# Patient Record
Sex: Female | Born: 1965 | Race: White | Hispanic: No | Marital: Single | Smoking: Current every day smoker
Health system: Southern US, Community
[De-identification: ages and names within clinical notes are randomized; demographics above are authoritative.]

## PROBLEM LIST (undated history)

## (undated) DIAGNOSIS — I509 Heart failure, unspecified: Secondary | ICD-10-CM

## (undated) DIAGNOSIS — M1990 Unspecified osteoarthritis, unspecified site: Secondary | ICD-10-CM

## (undated) DIAGNOSIS — M5136 Other intervertebral disc degeneration, lumbar region: Secondary | ICD-10-CM

## (undated) DIAGNOSIS — M51369 Other intervertebral disc degeneration, lumbar region without mention of lumbar back pain or lower extremity pain: Secondary | ICD-10-CM

## (undated) HISTORY — PX: OTHER SURGICAL HISTORY: SHX169

## (undated) HISTORY — PX: BACK SURGERY: SHX140

## (undated) HISTORY — PX: TOTAL KNEE ARTHROPLASTY: SHX125

---

## 2004-03-03 ENCOUNTER — Emergency Department (HOSPITAL_COMMUNITY): Admission: EM | Admit: 2004-03-03 | Discharge: 2004-03-04 | Payer: Self-pay | Admitting: *Deleted

## 2004-03-04 ENCOUNTER — Emergency Department (HOSPITAL_COMMUNITY): Admission: EM | Admit: 2004-03-04 | Discharge: 2004-03-04 | Payer: Self-pay | Admitting: Emergency Medicine

## 2005-09-18 ENCOUNTER — Emergency Department (HOSPITAL_COMMUNITY): Admission: EM | Admit: 2005-09-18 | Discharge: 2005-09-19 | Payer: Self-pay | Admitting: Emergency Medicine

## 2006-02-25 ENCOUNTER — Emergency Department (HOSPITAL_COMMUNITY): Admission: EM | Admit: 2006-02-25 | Discharge: 2006-02-26 | Payer: Self-pay | Admitting: Emergency Medicine

## 2006-02-27 ENCOUNTER — Inpatient Hospital Stay: Payer: Self-pay | Admitting: Internal Medicine

## 2006-03-01 ENCOUNTER — Inpatient Hospital Stay (HOSPITAL_COMMUNITY): Admission: EM | Admit: 2006-03-01 | Discharge: 2006-03-08 | Payer: Self-pay | Admitting: Emergency Medicine

## 2006-03-09 ENCOUNTER — Emergency Department (HOSPITAL_COMMUNITY): Admission: EM | Admit: 2006-03-09 | Discharge: 2006-03-09 | Payer: Self-pay | Admitting: Emergency Medicine

## 2006-03-17 ENCOUNTER — Emergency Department (HOSPITAL_COMMUNITY): Admission: EM | Admit: 2006-03-17 | Discharge: 2006-03-17 | Payer: Self-pay | Admitting: Emergency Medicine

## 2006-03-18 ENCOUNTER — Ambulatory Visit: Payer: Self-pay | Admitting: Psychiatry

## 2006-03-18 ENCOUNTER — Emergency Department (HOSPITAL_COMMUNITY): Admission: EM | Admit: 2006-03-18 | Discharge: 2006-03-19 | Payer: Self-pay | Admitting: Emergency Medicine

## 2006-03-19 ENCOUNTER — Inpatient Hospital Stay (HOSPITAL_COMMUNITY): Admission: AD | Admit: 2006-03-19 | Discharge: 2006-03-28 | Payer: Self-pay | Admitting: Psychiatry

## 2006-04-29 ENCOUNTER — Inpatient Hospital Stay (HOSPITAL_COMMUNITY): Admission: EM | Admit: 2006-04-29 | Discharge: 2006-05-04 | Payer: Self-pay | Admitting: Emergency Medicine

## 2006-05-07 ENCOUNTER — Inpatient Hospital Stay (HOSPITAL_COMMUNITY): Admission: EM | Admit: 2006-05-07 | Discharge: 2006-05-11 | Payer: Self-pay | Admitting: Emergency Medicine

## 2006-05-12 ENCOUNTER — Emergency Department (HOSPITAL_COMMUNITY): Admission: EM | Admit: 2006-05-12 | Discharge: 2006-05-12 | Payer: Self-pay | Admitting: *Deleted

## 2006-05-14 ENCOUNTER — Emergency Department (HOSPITAL_COMMUNITY): Admission: EM | Admit: 2006-05-14 | Discharge: 2006-05-14 | Payer: Self-pay | Admitting: Emergency Medicine

## 2006-05-14 ENCOUNTER — Ambulatory Visit: Payer: Self-pay | Admitting: Internal Medicine

## 2006-05-30 ENCOUNTER — Emergency Department: Payer: Self-pay

## 2006-08-09 ENCOUNTER — Emergency Department (HOSPITAL_COMMUNITY): Admission: EM | Admit: 2006-08-09 | Discharge: 2006-08-09 | Payer: Self-pay | Admitting: Emergency Medicine

## 2006-09-02 ENCOUNTER — Emergency Department (HOSPITAL_COMMUNITY): Admission: EM | Admit: 2006-09-02 | Discharge: 2006-09-02 | Payer: Self-pay | Admitting: Emergency Medicine

## 2006-10-10 ENCOUNTER — Emergency Department (HOSPITAL_COMMUNITY): Admission: EM | Admit: 2006-10-10 | Discharge: 2006-10-10 | Payer: Self-pay | Admitting: *Deleted

## 2008-01-06 ENCOUNTER — Emergency Department (HOSPITAL_COMMUNITY): Admission: EM | Admit: 2008-01-06 | Discharge: 2008-01-06 | Payer: Self-pay | Admitting: Emergency Medicine

## 2009-03-25 ENCOUNTER — Emergency Department (HOSPITAL_BASED_OUTPATIENT_CLINIC_OR_DEPARTMENT_OTHER): Admission: EM | Admit: 2009-03-25 | Discharge: 2009-03-25 | Payer: Self-pay | Admitting: Emergency Medicine

## 2009-03-25 ENCOUNTER — Ambulatory Visit: Payer: Self-pay | Admitting: Diagnostic Radiology

## 2009-10-27 ENCOUNTER — Emergency Department (HOSPITAL_BASED_OUTPATIENT_CLINIC_OR_DEPARTMENT_OTHER): Admission: EM | Admit: 2009-10-27 | Discharge: 2009-10-27 | Payer: Self-pay | Admitting: Emergency Medicine

## 2009-11-08 ENCOUNTER — Emergency Department (HOSPITAL_COMMUNITY): Admission: EM | Admit: 2009-11-08 | Discharge: 2009-11-08 | Payer: Self-pay | Admitting: Emergency Medicine

## 2009-11-15 ENCOUNTER — Emergency Department (HOSPITAL_COMMUNITY): Admission: EM | Admit: 2009-11-15 | Discharge: 2009-11-15 | Payer: Self-pay | Admitting: Emergency Medicine

## 2010-01-23 ENCOUNTER — Emergency Department (HOSPITAL_BASED_OUTPATIENT_CLINIC_OR_DEPARTMENT_OTHER): Admission: EM | Admit: 2010-01-23 | Discharge: 2010-01-23 | Payer: Self-pay | Admitting: Emergency Medicine

## 2010-01-23 ENCOUNTER — Ambulatory Visit: Payer: Self-pay | Admitting: Diagnostic Radiology

## 2010-05-29 LAB — PREGNANCY, URINE: Preg Test, Ur: NEGATIVE

## 2010-07-26 NOTE — Discharge Summary (Signed)
[name], Brooke [name]                 ACCOUNT NO.:  [id number]   MEDICAL RECORD NO.:  [id number]          PATIENT TYPE:  INP   LOCATION:  1341                         FACILITY:  [facility]   PHYSICIAN:  Hind I Elsaid, MD      DATE OF BIRTH:  1965-12-13   DATE OF ADMISSION:  05/07/2006  DATE OF DISCHARGE:  05/11/2006                               DISCHARGE SUMMARY   DISCHARGE DIAGNOSES:  1. Alcohol abuse with dependency.  2. Schizoaffective disorder.  3. Polysubstance abuse including tobacco, alcohol, and cocaine.  4. Chronic opioid dependence.  5. Generalized anxiety disorder.  6. History of previous admission for acute pancreatitis.   DISCHARGE MEDICATIONS:  The patient left the hospital on May 11, 2006.  We were called by the staff to see patient was missing.  The  patient called staff to say she was about 3 minutes from the hospital  and the patient made aware she could not come back to the room and she  should be readmitted since she left the premises without informing  anybody.   CONSULT:  Gastroenterology consulted for severe pain and psychiatry  consulted for alcoholic and request for detoxification.   #1 - She is a 45 year old female with a history of chronic pancreatitis,  history of polysubstance abuse on chronic opioid dependence.  She came  complaining of would like to detoxify and during hospitalization  continued with severe abdominal pain.  Abdominal pain could be  gastritis, pancreatitis.  CT scan was done with no evidence of acute or  chronic pancreatitis.  The patient continued on supportive measures with  Protonix.  During hospitalization the patient continued to demand many  Dilaudid for relieving of her epigastric pain.  Gastroenterology was  requested to see the patient for evaluation of her pain.  The patient  was scheduled to undergo EGD on February 29.   #2 - POLYSUBSTANCE ABUSE WITH A REQUEST TO DETOXIFY.  Psychiatry was  consulted, Dr. Jeanie Sewer, where  diagnosis was schizoaffective disorder,  alcohol dependence and polysubstance dependence.  The recommendation was  the first week of discharge ADS appointment and case management has  arranged the patient for rehab, AA group to help, and increase Seroquel  to 300 mg p.o. q.h.s.   On February 29 the patient was missing from her room.  They called the  patient.  The patient admitted she went outside and would like to get  back to the hospital.  The hospital staff refused to put the patient  back on her bed unless she was readmitted from the beginning and the  patient refused to leave and she refused to sign AMA, so on May 11, 2006, the patient left the hospital.     Hind Bosie Helper, MD  Electronically Signed    HIE/MEDQ  D:  08/09/2006  T:  08/09/2006  Job:  [id number]

## 2010-07-29 NOTE — Discharge Summary (Signed)
NAMESHENE, MAXFIELD                 ACCOUNT NO.:  [id number]   MEDICAL RECORD NO.:  [id number]          PATIENT TYPE:  IPS   LOCATION:  0307                          FACILITY:  [facility]   PHYSICIAN:  Anselm Jungling, MD  DATE OF BIRTH:  1965-08-17   DATE OF ADMISSION:  03/19/2006  DATE OF DISCHARGE:  03/28/2006                               DISCHARGE SUMMARY   IDENTIFYING DATA AND REASON FOR ADMISSION:  The patient is a 45 year old  female, who requested detoxification from alcohol.  She reported that  she was losing my grip and not caring about life.  She reported  increasing suicidal ideation and worsening depression.  Her heavy  drinking had been longstanding, and with it, she had a history of  pancreatitis.  Please refer to the admission note for further details  pertaining to the symptoms, circumstances and history that led to her  hospitalization.  She was given initial Axis I diagnosis of alcohol  dependence and rule out mood disorder NOS.   MEDICAL AND LABORATORY:  The patient was medically and physically  assessed both in the emergency department, and then by our psychiatric  nurse practitioner upon arrival to the unit.  Her amylase and lipase  levels had been within normal limits, and she was not felt to have any  active pancreatitis.  She did have a history of pain, and on a p.r.n.  basis Percocet was available for pain management.  There were no acute  medical issues.   HOSPITAL COURSE:  The patient was admitted to the adult inpatient  psychiatric service.  She presented as a well-nourished, well-developed  female, who was very tired, hopeless, helpless and depressed.  She was  fully oriented, and there were no signs or symptoms of psychosis or  thought disorder.  She denied suicidal ideation, but reported that she  felt she was at the bottom.  She verbalized a strong desire for help.   She was placed on a Librium withdrawal protocol, and in addition started  on a trial  of Effexor XR 37.5 mg daily to address depression.  To  further assist her with complaints of anxiety and agitation, she was  started on a trial of low-dose Risperdal which was eventually raised to  a level of 0.5 mg t.i.d.  To assist further with sleep, Seroquel 75 mg  at bedtime was ordered.  Protonix 40 mg daily was ordered for gastritis  symptoms.   She was involved in milieu therapy and encouraged to attend various  therapeutic groups and activities, including those geared towards 12-  step recovery.  She needed a great deal of encouragement to attend  groups over the first several days, and spent a lot of extra time in  bed.   The patient tended to minimize the significance of her alcoholism.  Instead, she seemed to regard it as secondary issue, i.e., she stated  she believed that were it not for her various circumstantial and  situational problems and her depression over them, she would not drink  excessively.   The patient worked closely with the  case manager regarding aftercare and  discharge planning.  On the sixth hospital day there was a family  counseling session involving the patient and her boyfriend.  In that  meeting she stated that she was not suicidal at that time.  The patient  and her boyfriend discussed that the patient does not need to drink  anymore, and they discussed the fact that the patient does not currently  have housing.  The patient's boyfriend stated that the patient has some  medical illnesses that she needs to be treated for and he is concerned  about that.  The patient stated that she wanted to leave the inpatient  facility because she did not feel that she was getting the proper  treatment.  The patient and her boyfriend indicated that the patient  needed to be able to get her medications at low cost.  They talked about  the fact that they are not able to live anywhere together, the boyfriend  currently living with his parents.  The patient  indicated that she was  willing to go an inpatient chemical dependency treatment center.  The  boyfriend stated that he would be able to provide housing for the  patient in the following month, but not most likely at the time of  discharge.   Discharge planning with this patient was challenging.  She was  encouraged to call [facility], but appeared to be reluctant to do so  and gave various reasons why she did not do so when asked to.  She  seemed convinced that we would be able to find her some form of long-  term inpatient chemical dependency program, which was not feasible in  the short term due to problems of such program availability.  Finally,  it was explained to the patient that if she did not arrange something  along the lines of an [address], that we would have no choice but  discharging her to a women's homeless shelter.  On the final hospital  day, she did contact an [address] and made such arrangements.  At the  time of discharge the patient was absent suicidal ideation.  She still  remained depressed and quite apprehensive about her overall situation,  but it appeared clear that she had benefited as much as possible from  our inpatient program.  She denied suicidal ideation at that time.   AFTERCARE:  The patient was to follow-up at the [facility] office of the  [facility] with an appointment on January 22.   DISCHARGE MEDICATIONS:  Protonix 40 mg daily, Seroquel 75 mg q.p.m.,  Risperdal 0.5 mg t.i.d., Effexor XR 37.5 mg daily, and Percocet 1-2  tablets as needed every eight hours for pain.  The patient was  encouraged to attend Alcoholics Anonymous meetings.   DISCHARGE DIAGNOSES:  AXIS I: Alcohol dependence, early remission, and  depressive disorder NOS.  AXIS II: Deferred.  AXIS III: History of pancreatitis.  AXIS IV: Stressors severe.  AXIS V: GAF on discharge 65.      Anselm Jungling, MD Electronically Signed     SPB/MEDQ  D:  03/29/2006   T:  03/29/2006  Job:  [id number]
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
## 2010-07-29 NOTE — Discharge Summary (Signed)
NAMENAVYA, [name]                 ACCOUNT NO.:  [id number]   MEDICAL RECORD NO.:  [id number]          PATIENT TYPE:  INP   LOCATION:  1323                         FACILITY[facility]  [facility]   PHYSICIAN:  Hettie Holstein, D.O.    DATE OF BIRTH:  April 04, 1965   DATE OF ADMISSION:  04/28/2006  DATE OF DISCHARGE:  05/04/2006                               DISCHARGE SUMMARY   DISCHARGE SUMMARY   PRIMARY CARE PHYSICIAN:  Unassigned.   FINAL DIAGNOSIS:  Alcoholic related pancreatitis, symptomatically  resolved.   SECONDARY DIAGNOSES:  1. Alcohol dependency.  2. E coli urinary tract infection.  3. Iron deficiency anemia.  4. Tobacco abuse.  5. Schizoaffective disorder status post inpatient DETOX.   MEDICATIONS ON DISCHARGE:  Cipro 250 mg twice daily until May 07, 2006.  Prozac 20 mg daily.  Seroquel 50 mg q.h.s.  Phenergan 12.5 mg every 4 hours as needed for nausea #15.  Percocet 5/325 1 every 4 hours as needed for pain.  Nicotine patch 21 mg/hour daily as needed and she was instructed to stop  smoking.  Valium 5 mg twice daily as needed.  Iron sulfate 325 mg by mouth twice daily.   She was instructed to stop alcohol in her disposition and had been a  struggle in the hospital to mobilize outpatient services for [name]  in terms of alcohol DETOX programs.  She had been remaining in hospital  until she could be transitioned to an inpatient psychiatric hospital;  however, this could not be coordinated.  Her hospital course  predominantly was that of administering IV narcotics and pain  medications.  Her symptoms of pancreatitis had resolved.  She had not  had elevations in her amylase or lipase in addition.  In any event, she  was felt to be medically stable for discharge home.  She reported being  homeless.  Case Management was involved and it was determined that she  was not actually homeless.  She had been working on trying to find a  halfway house.  She had a pastor that she was  working with.  In any  event, she was agreeable to home discharge on medications as mentioned  above.      Hettie Holstein, D.O.  Electronically Signed     ESS/MEDQ  D:  06/21/2006  T:  06/21/2006  Job:  [id number]

## 2010-07-29 NOTE — H&P (Signed)
[name], [name]                 ACCOUNT NO.:  [id number]   MEDICAL RECORD NO.:  [id number]          PATIENT TYPE:  INP   LOCATION:  1826                         FACILITY:  [facility]   PHYSICIAN:  Jackie Plum, M.D.DATE OF BIRTH:  [birth year]   DATE OF ADMISSION:  03/01/2006  DATE OF DISCHARGE:                              HISTORY & PHYSICAL   CHIEF COMPLAINT:  Abdominal pain.   HISTORY OF PRESENT ILLNESS:  The patient is a 45 year old Caucasian lady  with history of alcoholism and drug dependence.  She denies any previous  history of diabetes, hypertension or heart disease.  She apparently came  to [facility] ED and was transferred to detox at [facility] a couple of days ago.  Once at detox the patient complained of  abdominal pain and she was transferred to the ED whereupon she was  diagnosed with acute pancreatitis and admitted to the hospital for  further care.  She had been NPO on IV fluids and supportive care with  analgesics.  The patient had a CT scan which showed pancreatic  inflammation.  The patient continued to have pain and apparently was  unhappy with the care that she was receiving there and, therefore,  signed herself out from the hospital at [facility]  and came to the [facility] system.  At [facility] ED the  patient was seen by Dr. Deretha Emory of ER medicine.  He ordered a  metabolic panel which was unremarkable except for marginal low sodium of  134 and glucose of 108 with BUN of 1.  UA was negative for evidence of  urinary tract infection.  Her lipase was elevated at 132.  Her liver  function panel was within normal limits except for albumin of 3.1 with  alkaline phosphatase of 148.  (Her AST and ALT were 20 and 30  respectively.)  Her CBC was notable for WBC count of 11.8 with normal  hemoglobin and hematocrit.  She is admitted for further management of  acute pancreatitis, which is at the moment incompletely  treated.   PAST MEDICAL HISTORY:  As mentioned above.  The patient is status post  disk surgery involving the neck.   FAMILY HISTORY:  Positive for heart disease.   SOCIAL HISTORY:  The patient used to drink about a 6 cups of alcohol  daily and smoked cigarettes.  She denies any illicit drug use.   REVIEW OF SYSTEMS:  The patient denies any fever or chills.  She denies  any visual disturbance or extremity weakness.  No chest pain, no  shortness of breath, no cough or sputum production.  She has been  nauseated and has had some vomited which is improved __________  abdominal pain.  No diarrhea or constipation.  No dysuria or frequency  of micturition.  No arthralgias or myalgias.  No pruritus or rash.  No  headaches or generalized weakness.  Denies any polyuria or polydipsia.   PHYSICAL EXAMINATION:  BP 134/90, pulse 82, respirations 18, temp 97.5  degrees Fahrenheit.  The patient was in no acute cardiopulmonary  distress.  HEENT:  Normocephalic and atraumatic.  Pupils equal, round and reactive  to light.  Extraocular movements intact.  Oropharynx moist.  NECK:  Supple, no JVD.  LUNGS:  Clear to auscultation.  CARDIAC:  Regular rate and rhythm, no gallops or murmur.  ABDOMEN:  Full, soft with some epigastric tenderness, without any  rebound tenderness noted.  Bowel sounds are present.  EXTREMITIES:  No cyanosis and no edema.  CNS:  Exam was nonfocal.   LABORATORY WORK:  As noted above.   IMPRESSION:  1. Acute pancreatitis.  2. Alcoholism.  3. __________ mild hyponatremia.   The patient is admitted for IV fluid supplementation, analgesics,  antiemetics, __________  and other supportive measures.  In addition,  she will be watched carefully for withdrawal.  She will be continued on  thiamine and folic acid.  We will follow up on her electrolytes.      Jackie Plum, M.D.  Electronically Signed     GO/MEDQ  D:  03/01/2006  T:  03/01/2006  Job:  [id number]

## 2010-07-29 NOTE — Discharge Summary (Signed)
[name], Brooke [name]                 ACCOUNT NO.:  [id number]   MEDICAL RECORD NO.:  [id number]          PATIENT TYPE:  INP   LOCATION:  5729                         FACILITY:  [facility]   PHYSICIAN:  Corinna L. Lendell Caprice, MDDATE OF BIRTH:  [birth year]   DATE OF ADMISSION:  03/01/2006  DATE OF DISCHARGE:  03/08/2006                               DISCHARGE SUMMARY   DISCHARGE DIAGNOSES:  1. Alcoholic pancreatitis.  2. Alcohol abuse and dependence.  3. Anemia.  4. Hypokalemia.   DISCHARGE MEDICATIONS:  1. Dilaudid 4-8 mg every 4 hours as needed for pain.  2. Phenergan 12.5 mg every 6 hours as needed for nausea.   DIET:  Diet should be low fat for 2 weeks and regular.   FOLLOW UP:  She is to follow up with her primary care physician as  needed.  She is encouraged to quit drinking and attend outpatient  alcohol rehab and/or Alcoholics Anonymous.   CONDITION:  Stable.   CONSULTATIONS:  None.   PROCEDURES:  None.   PERTINENT LABORATORIES:  CBC on admission was significant for a white  blood cell count of 11.8, otherwise unremarkable.  Complete metabolic  panel on admission was significant for a sodium of 134, albumin 3.1,  alkaline phosphatase 148, lipase 132, amylase 145, otherwise  unremarkable.  Urine pregnancy negative.  UA showed large hemoglobin,  negative nitrite, trace leukocyte esterase, trace protein, negative  ketones, specific gravity 1.012.  Chest x-ray showed mild linear  atelectasis and/or scarring at the left base.   HISTORY AND HOSPITAL COURSE:  [name] is a 45 year old white female  patient unassigned who presented with abdominal pain.  She has a history  of alcoholism.  Apparently she was in the hospital at [facility] for  detox.  She apparently had abdominal pain and was diagnosed with  pancreatitis.  She had a CAT scan and laboratory work.  Apparently she  did not like the care she was getting and signed herself out from  [facility] and came to [facility].  She did have  abdominal tenderness and elevated lipase.  She was admitted for IV  fluids, antiemetics and pain medications.  Her diet was slowly able to  be advanced and at the time of  discharge she still continued to complain of pain but she was noted to  be leaving the unit almost continuously to go out and smoke, despite  orders not to do so.  At the time of discharge she had no emesis, she  was tolerating a diet and requesting to be discharged home.      Corinna L. Lendell Caprice, MD  Electronically Signed     CLS/MEDQ  D:  04/24/2006  T:  04/24/2006  Job:  [id number]

## 2010-07-29 NOTE — H&P (Signed)
Brooke [name], Brooke [name]                 ACCOUNT NO.:  [id number]   MEDICAL RECORD NO.:  [id number]          PATIENT TYPE:  INP   LOCATION:  1323                         FACILITY:  [facility]   PHYSICIAN:  Della Goo, M.D. DATE OF BIRTH:  [birth year]   DATE OF ADMISSION:  04/29/2006  DATE OF DISCHARGE:                              HISTORY & PHYSICAL   PRIMARY CARE PHYSICIAN:  Unassigned.   CHIEF COMPLAINT:  Abdominal pain.   HISTORY OF PRESENT ILLNESS:  This is a 45 year old female who presented  to the emergency department secondary to worsening abdominal pain over a  2-day period.  She reports that her pain is epigastric in location,  nonradiating, and rates the pain an 8/10.  The pain is sharp and  stabbing.  The patient reports having a history of pancreatitis and also  a history of heavy alcohol usage/abuse.  She has had symptoms of nausea,  vomiting and diarrhea for 2 days as well.  The patient also presented to  the emergency department for evaluation for alcohol and substance abuse  rehabilitation.  In the emergency department the patient was evaluated  and found to have an elevated white blood cell count of 18,000.  Her  lipase level was found to be 20.  She was referred for admission.   PAST MEDICAL HISTORY:  Pancreatitis, self-reported.   MEDICATIONS:  None.   PAST SURGICAL HISTORY:  1. Status post bilateral tubal ligation.  2. Status post cervical disk surgery and fusion, C5-C6, performed in      [place], [place].   ALLERGIES:  NO KNOWN DRUG ALLERGIES.   SOCIAL HISTORY:  The patient smokes and has a heavy alcohol usage  history for 2 years and also reports abusing cocaine.   FAMILY HISTORY:  Noncontributory.   PHYSICAL EXAMINATION:  GENERAL:  This is a 45 year old well-nourished,  well-developed female in discomfort but no acute distress.  VITAL SIGNS:  Temperature 97.8, blood pressure 132/84, heart rate 76,  respirations 20, O2 saturations 99-100% on  room air.  HEENT:  Normocephalic, atraumatic.  There is no scleral icterus.  Pupils are  equally round and reactive to light.  Extraocular muscles are intact.  Funduscopic benign.  Oropharynx is clear.  NECK:  Supple, full range of motion.  No thyromegaly, adenopathy, or  jugular venous distension.  CARDIOVASCULAR:  Regular rate and rhythm.  LUNGS:  Clear to auscultation bilaterally.  ABDOMEN:  Positive bowel sounds, soft, mildly tender in the epigastrium.  No rebound, no guarding.  No hepatosplenomegaly.  EXTREMITIES:  Without  cyanosis, clubbing or edema.  NEUROLOGIC:  Nonfocal.  There is no costovertebral angle tenderness and  no spinous process tenderness.   LABORATORY STUDIES:  White blood cell count 18.4, hemoglobin 14.1,  hematocrit 42.1, platelets 426, neutrophils 66, lymphocytes 25.  Alcohol  level less than 5.  Sodium 137, potassium 3.1, chloride 105, CO2 23, BUN  2, creatinine 0.9, glucose 105.  Albumin 4.0, AST 20, ALT 13, alkaline  phosphatase 121, total bilirubin 0.5, lipase 20.  Urine drug screen  positive for opiates; otherwise, negative.  Urinalysis  reveals positive  nitrites, negative leukocyte esterase.  Urine pregnancy test negative.   ASSESSMENT:  45 year old female with epigastric abdominal pain being  admitted with:  1. Abdominal pain.  2. Acute with chronic pancreatitis.  3. Urinary tract infection.  4. Alcohol and substance abuse.  5. Hypokalemia.   PLAN:  The patient will be admitted, placed on IV fluids and a clear  liquid diet for bowel rest.  IV Protonix q.12h. has been ordered.  Blood  cultures and urine cultures have been ordered.  The patient will be  placed on IV ciprofloxacin.  Potassium replacement has also been  ordered.  The patient will be placed on medications for alcohol  withdrawal symptoms and nutritional deficiencies secondary to  alcoholism.  The alcohol withdrawal protocol will start in 48 hours or  sooner pending the patient's  symptoms.  Substance abuse and alcohol  abuse counseling have been ordered, and further recommendations  regarding outpatient therapy will be made per the primary medical team.      Della Goo, M.D.  Electronically Signed     HJ/MEDQ  D:  04/29/2006  T:  04/29/2006  Job:  [id number]

## 2010-12-26 LAB — CBC
Platelets: 230
WBC: 14 — ABNORMAL HIGH

## 2010-12-26 LAB — COMPREHENSIVE METABOLIC PANEL
AST: 18
Albumin: 3.5
Alkaline Phosphatase: 130 — ABNORMAL HIGH
Chloride: 109
Creatinine, Ser: 0.7
GFR calc Af Amer: >60
Potassium: 4.1
Sodium: 138
Total Bilirubin: 0.6

## 2010-12-26 LAB — RAPID URINE DRUG SCREEN, HOSP PERFORMED
Barbiturates: NOT DETECTED
Benzodiazepines: NOT DETECTED
Cocaine: POSITIVE — AB
Opiates: POSITIVE — AB

## 2010-12-26 LAB — URINALYSIS, ROUTINE W REFLEX MICROSCOPIC
Glucose, UA: NEGATIVE
Ketones, ur: NEGATIVE
Leukocytes, UA: NEGATIVE
Protein, ur: NEGATIVE

## 2010-12-26 LAB — DIFFERENTIAL
Basophils Relative: 0
Lymphocytes Relative: 29
nRBC: 0

## 2010-12-26 LAB — URINE MICROSCOPIC-ADD ON

## 2010-12-28 LAB — URINALYSIS, ROUTINE W REFLEX MICROSCOPIC
Bilirubin Urine: NEGATIVE
Nitrite: NEGATIVE
Specific Gravity, Urine: 1.023
pH: 5.5

## 2010-12-28 LAB — DIFFERENTIAL
Eosinophils Absolute: 0
Eosinophils Relative: 1
Lymphocytes Relative: 26
Lymphs Abs: 2
Monocytes Absolute: 0.7
Neutro Abs: 4.9

## 2010-12-28 LAB — COMPREHENSIVE METABOLIC PANEL
AST: 25
Albumin: 3.7
Calcium: 9.1
Creatinine, Ser: 0.82
GFR calc Af Amer: >60

## 2010-12-28 LAB — CBC
MCHC: 33.1
MCV: 90.4
Platelets: 334
RDW: 16.5 — ABNORMAL HIGH

## 2010-12-28 LAB — URINE MICROSCOPIC-ADD ON

## 2010-12-28 LAB — LIPASE, BLOOD: Lipase: 51

## 2010-12-28 LAB — PREGNANCY, URINE: Preg Test, Ur: NEGATIVE

## 2011-08-24 IMAGING — CR DG CERVICAL SPINE COMPLETE 4+V
6 series · 6 of 6 positions shown · non-contrast
Comparison: 11/15/2009and a CT scan dated 11/08/2009

CLINICAL DATA: Neck pain after fall today.  Previous anterior
cervical fusion at C6-7.

CERVICAL SPINE - COMPLETE 4+ VIEW

[w c-spine lat *]
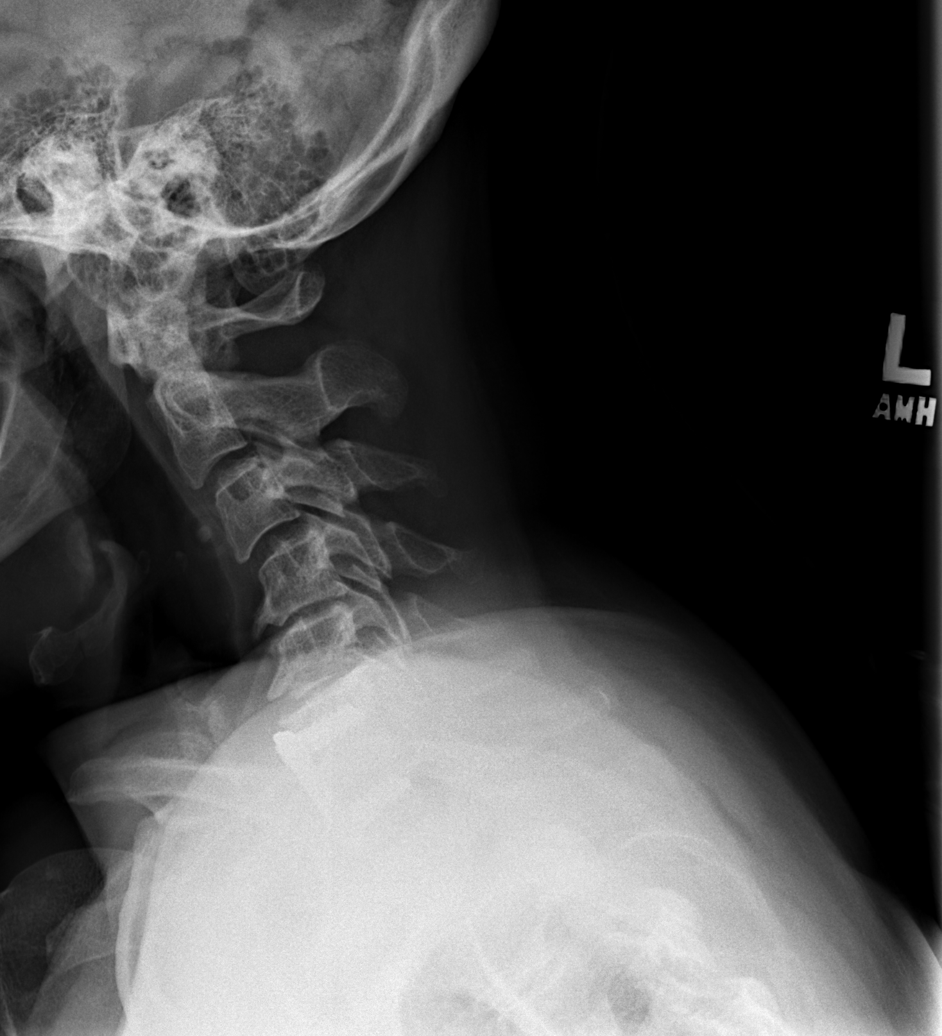

[w c-spine oblique * (1 of 2)]
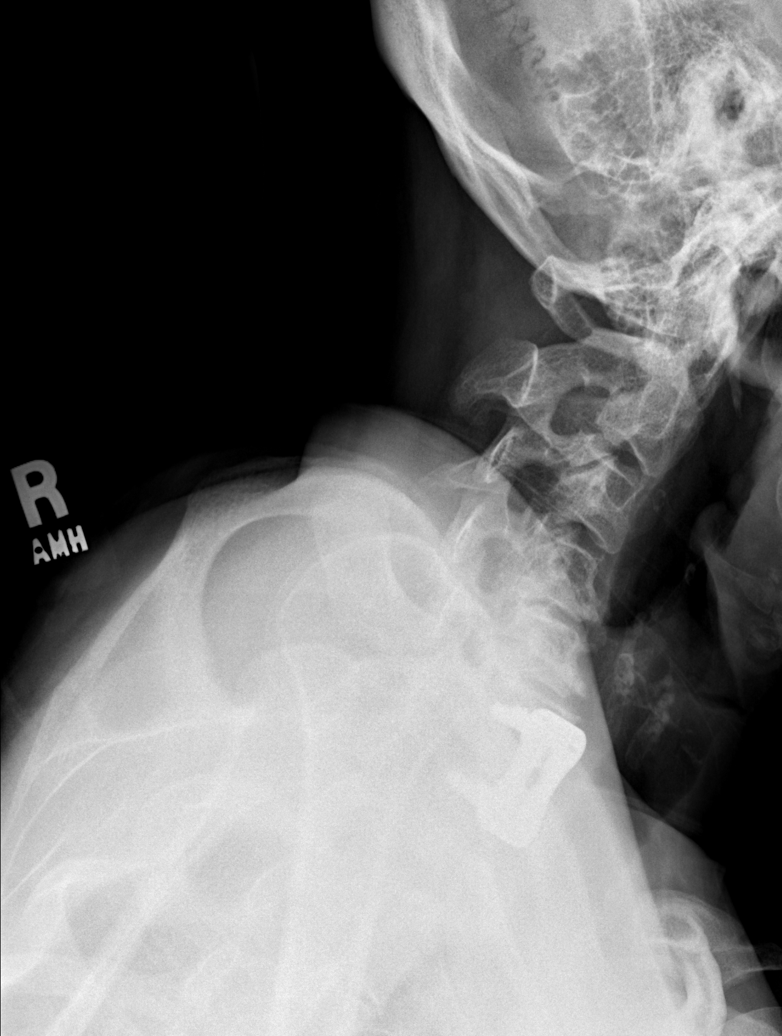

[w c-spine oblique * (2 of 2)]
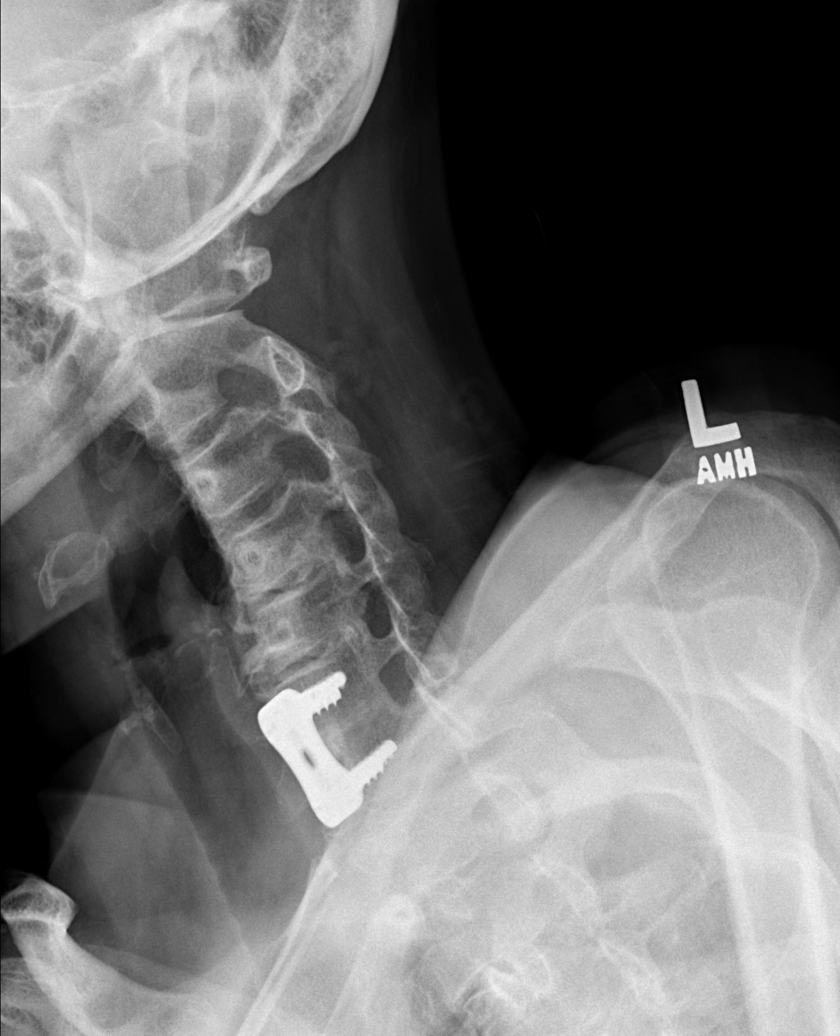

[w c-spine a.p.]
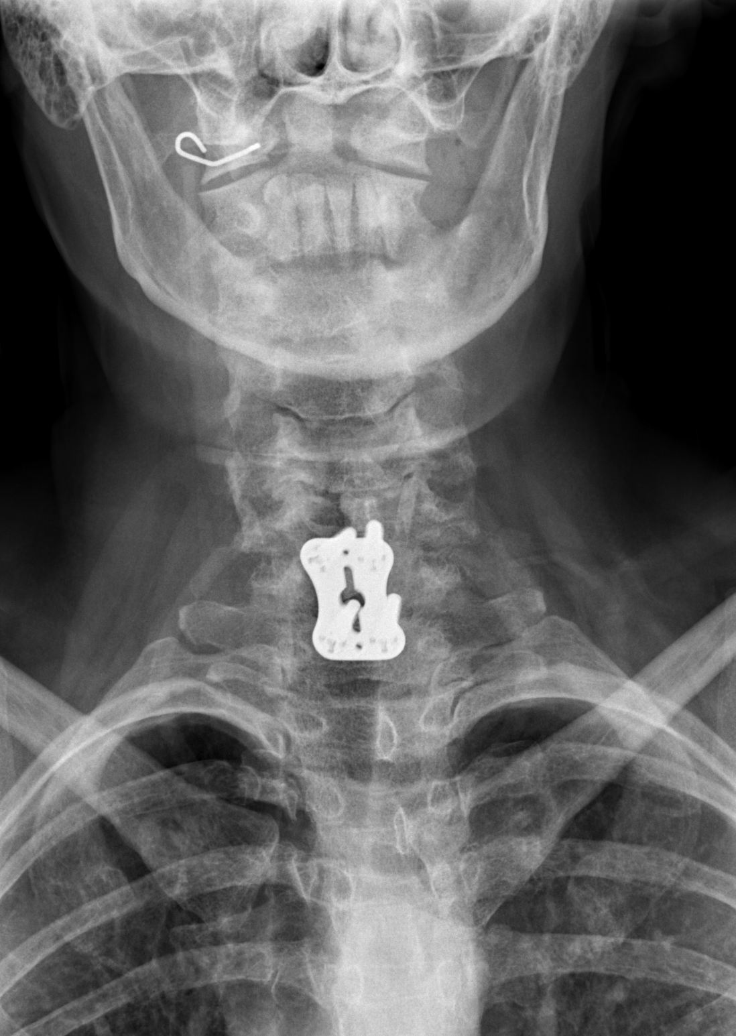

[w c-spine odontoid]
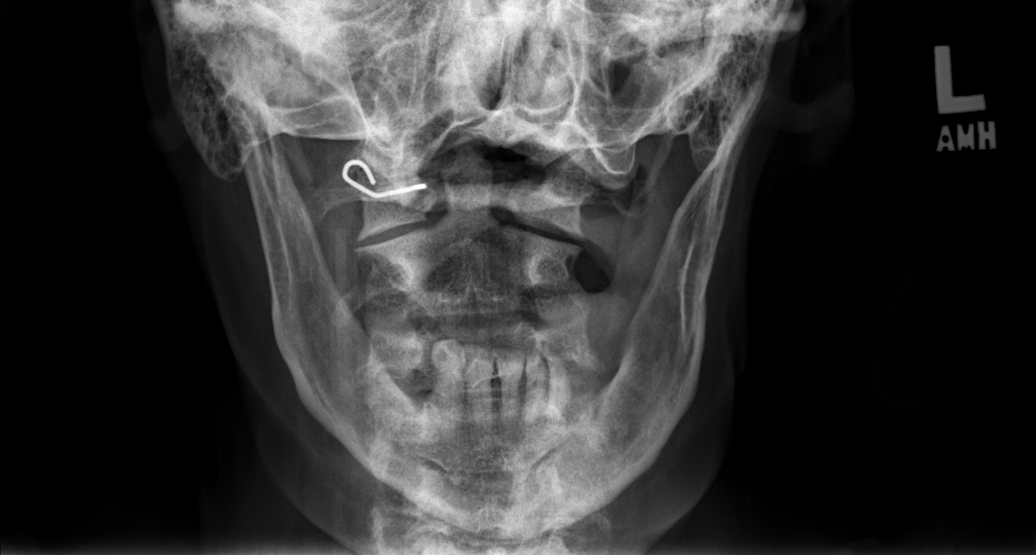

[w swimmers view]
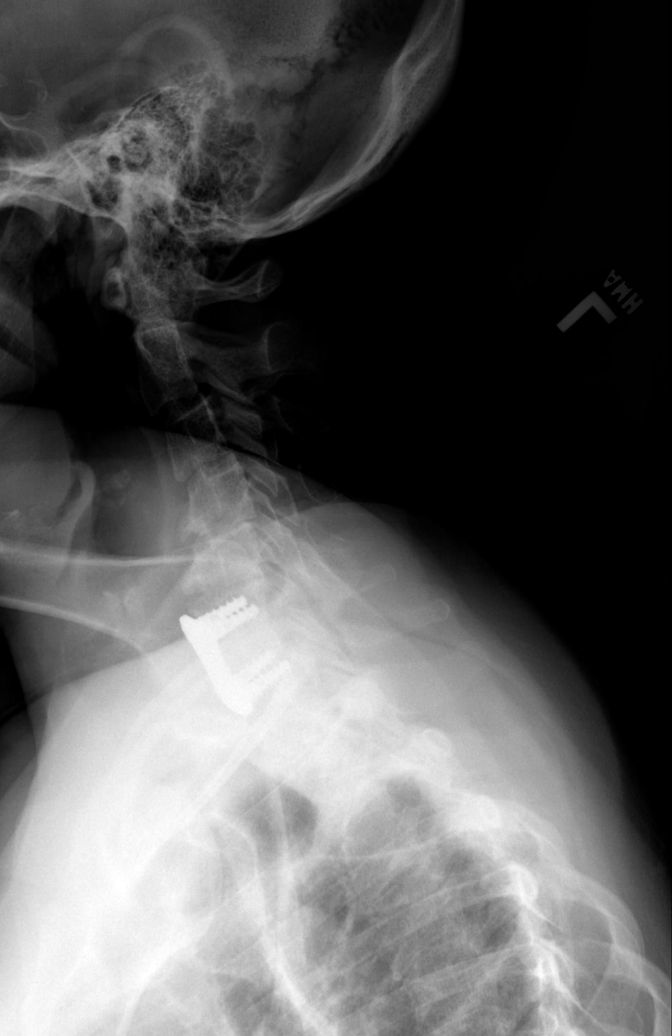

[6 of 6 positions shown; findings below may reference images not displayed]

FINDINGS: There is no acute fracture.  There is a solid anterior
fusion at C6-7.  There is degenerative disc disease at C4-5 and C5-
6.  There is a  slight anterior subluxation of C3 on C4 which is
not felt to be significant.

There is no prevertebral soft tissue swelling.
IMPRESSION: No acute abnormality of the cervical spine.

## 2017-05-29 ENCOUNTER — Emergency Department (HOSPITAL_BASED_OUTPATIENT_CLINIC_OR_DEPARTMENT_OTHER)
Admission: EM | Admit: 2017-05-29 | Discharge: 2017-05-29 | Disposition: A | Discharge status: Home / Self Care | Payer: Medicaid Other | Attending: Emergency Medicine | Admitting: Emergency Medicine

## 2017-05-29 ENCOUNTER — Emergency Department (HOSPITAL_BASED_OUTPATIENT_CLINIC_OR_DEPARTMENT_OTHER): Payer: Medicaid Other

## 2017-05-29 ENCOUNTER — Encounter: Payer: Self-pay | Admitting: Emergency Medicine

## 2017-05-29 DIAGNOSIS — R509 Fever, unspecified: Secondary | ICD-10-CM | POA: Diagnosis not present

## 2017-05-29 DIAGNOSIS — R197 Diarrhea, unspecified: Secondary | ICD-10-CM | POA: Insufficient documentation

## 2017-05-29 DIAGNOSIS — R1013 Epigastric pain: Secondary | ICD-10-CM | POA: Diagnosis present

## 2017-05-29 DIAGNOSIS — R079 Chest pain, unspecified: Secondary | ICD-10-CM | POA: Diagnosis not present

## 2017-05-29 DIAGNOSIS — R112 Nausea with vomiting, unspecified: Secondary | ICD-10-CM

## 2017-05-29 LAB — LIPASE, BLOOD: Lipase: 25 U/L (ref 11–51)

## 2017-05-29 LAB — COMPREHENSIVE METABOLIC PANEL
ALBUMIN: 4.4 g/dL (ref 3.5–5.0)
ALT: 15 U/L (ref 14–54)
ANION GAP: 15 (ref 5–15)
AST: 35 U/L (ref 15–41)
Alkaline Phosphatase: 126 U/L (ref 38–126)
BILIRUBIN TOTAL: 0.6 mg/dL (ref 0.3–1.2)
BUN: 9 mg/dL (ref 6–20)
CO2: 18 mmol/L — ABNORMAL LOW (ref 22–32)
Calcium: 9.5 mg/dL (ref 8.9–10.3)
Chloride: 103 mmol/L (ref 101–111)
Creatinine, Ser: 0.9 mg/dL (ref 0.44–1.00)
GFR calc non Af Amer: >60 mL/min (ref >60)
GLUCOSE: 191 mg/dL — AB (ref 65–99)
POTASSIUM: 3.6 mmol/L (ref 3.5–5.1)
Sodium: 136 mmol/L (ref 135–145)
TOTAL PROTEIN: 8 g/dL (ref 6.5–8.1)

## 2017-05-29 LAB — URINALYSIS, ROUTINE W REFLEX MICROSCOPIC
Bilirubin Urine: NEGATIVE
Glucose, UA: NEGATIVE mg/dL
Ketones, ur: 15 mg/dL — AB
Leukocytes, UA: NEGATIVE
NITRITE: NEGATIVE
PROTEIN: NEGATIVE mg/dL
Specific Gravity, Urine: 1.02 (ref 1.005–1.030)
pH: 6 (ref 5.0–8.0)

## 2017-05-29 LAB — CBC WITH DIFFERENTIAL/PLATELET
BASOS ABS: 0 10*3/uL (ref 0.0–0.1)
BASOS PCT: 0 %
Eosinophils Absolute: 0 10*3/uL (ref 0.0–0.7)
Eosinophils Relative: 0 %
HEMATOCRIT: 40.2 % (ref 36.0–46.0)
HEMOGLOBIN: 13.5 g/dL (ref 12.0–15.0)
Lymphocytes Relative: 11 %
Lymphs Abs: 1.8 10*3/uL (ref 0.7–4.0)
MCH: 31.5 pg (ref 26.0–34.0)
MCHC: 33.6 g/dL (ref 30.0–36.0)
MCV: 93.7 fL (ref 78.0–100.0)
Monocytes Absolute: 0.7 10*3/uL (ref 0.1–1.0)
Monocytes Relative: 4 %
NEUTROS ABS: 13.8 10*3/uL — AB (ref 1.7–7.7)
Neutrophils Relative %: 85 %
Platelets: 331 10*3/uL (ref 150–400)
RBC: 4.29 MIL/uL (ref 3.87–5.11)
RDW: 13.5 % (ref 11.5–15.5)
WBC: 16.3 10*3/uL — AB (ref 4.0–10.5)

## 2017-05-29 LAB — URINALYSIS, MICROSCOPIC (REFLEX)

## 2017-05-29 LAB — TROPONIN I

## 2017-05-29 MED ORDER — MORPHINE SULFATE (PF) 4 MG/ML IV SOLN
4.0000 mg | Freq: Once | INTRAVENOUS | Status: AC
  Administered 2017-05-29: 4 mg via INTRAVENOUS
  Filled 2017-05-29: qty 1

## 2017-05-29 MED ORDER — ONDANSETRON HCL 4 MG/2ML IJ SOLN: 4.0000 mg | Freq: Once | INTRAMUSCULAR | Status: DC | PRN

## 2017-05-29 MED ORDER — GI COCKTAIL ~~LOC~~
30.0000 mL | Freq: Once | ORAL | Status: AC
  Administered 2017-05-29: 30 mL via ORAL
  Filled 2017-05-29: qty 30

## 2017-05-29 MED ORDER — SODIUM CHLORIDE 0.9 % IV BOLUS (SEPSIS)
1000.0000 mL | Freq: Once | INTRAVENOUS | Status: AC
  Administered 2017-05-29: 1000 mL via INTRAVENOUS

## 2017-05-29 MED ORDER — ONDANSETRON 4 MG PO TBDP
4.0000 mg | ORAL_TABLET | Freq: Once | ORAL | Status: AC
  Administered 2017-05-29: 4 mg via ORAL
  Filled 2017-05-29: qty 1

## 2017-05-29 MED ORDER — ONDANSETRON 4 MG PO TBDP: 4.0000 mg | ORAL_TABLET | Freq: Three times a day (TID) | ORAL | 0 refills | Status: AC | PRN

## 2017-05-29 NOTE — ED Notes (Addendum)
ED Provider at bedside. Pt has had 240 cc sprite with no emesis.

## 2017-05-29 NOTE — ED Notes (Addendum)
Pt d/c amb after speaking with dr. Eudelia Bunchcardama.

## 2017-05-29 NOTE — ED Notes (Signed)
Pt updated on plan of care, plan to d/c her home. Pt repeatedly states "I need something strong for my pain." iv d/c, pt states "I need to talk to the doctor again, I don't think I can sit up with this pain." dr. Eudelia Bunchcardama alerted.

## 2017-05-29 NOTE — ED Provider Notes (Signed)
[facility] EMERGENCY DEPARTMENT Provider Note   CSN: [id number] Arrival date & time: 05/29/17  0600     History   Chief Complaint Chief Complaint  Patient presents with  . Emesis    HPI Brooke [name] is a 52 y.o. female.  The history is provided by the patient.  She had onset last night of epigastric pain with radiation to the chest and associated nausea and vomiting.  She has had subjective fever as well as chills and sweats.  Pain is sharp and she rates it at 8/10.  Chest discomfort is described as a pressure feeling.  It is not improved following emesis.  Nothing makes the pain better, nothing makes it worse.  She denies any sick contacts.  She did get a dose of ondansetron in the ambulance, and nausea has improved.  Pain is unchanged.  She does have a history of tobacco abuse, but no history of hypertension or diabetes or hyperlipidemia.  History reviewed. No pertinent past medical history.  There are no active problems to display for this patient.   Past Surgical History:  Procedure Laterality Date  . BACK SURGERY      OB History    No data available       Home Medications    Prior to Admission medications   Not on File    Family History No family history on file.  Social History Social History   Tobacco Use  . Smoking status: Never Smoker  . Smokeless tobacco: Never Used  Substance Use Topics  . Alcohol use: Yes  . Drug use: No     Allergies   Iohexol   Review of Systems Review of Systems  All other systems reviewed and are negative.    Physical Exam Updated Vital Signs BP (!) 154/80 (BP Location: Right Arm)   Pulse 76   Temp 98.2 F (36.8 C) (Oral)   Resp 14   SpO2 99%   Physical Exam  Nursing note and vitals reviewed.  52 year old female, appears uncomfortable, but is in no acute distress. Vital signs are significant for elevated systolic blood pressure. Oxygen saturation is 99%, which is normal. Head is  normocephalic and atraumatic. PERRLA, EOMI. Oropharynx is clear. Neck is nontender and supple without adenopathy or JVD. Back is nontender and there is no CVA tenderness. Lungs are clear without rales, wheezes, or rhonchi. Chest is nontender. Heart has regular rate and rhythm without murmur. Abdomen is soft, flat, with moderate epigastric tenderness.  There is no rebound or guarding.  There are no masses or hepatosplenomegaly and peristalsis is hypoactive. Extremities have no cyanosis or edema, full range of motion is present. Skin is warm and dry without rash. Neurologic: Mental status is normal, cranial nerves are intact, there are no motor or sensory deficits.  ED Treatments / Results  Labs (all labs ordered are listed, but only abnormal results are displayed) Labs Reviewed  COMPREHENSIVE METABOLIC PANEL - Abnormal; Notable for the following components:      Result Value   CO2 18 (*)    Glucose, Bld 191 (*)    All other components within normal limits  CBC WITH DIFFERENTIAL/PLATELET - Abnormal; Notable for the following components:   WBC 16.3 (*)    Neutro Abs 13.8 (*)    All other components within normal limits  LIPASE, BLOOD  TROPONIN I  URINALYSIS, ROUTINE W REFLEX MICROSCOPIC    Radiology No results found.  Procedures Procedures  Medications Ordered in  ED Medications  ondansetron (ZOFRAN) injection 4 mg (not administered)  morphine 4 MG/ML injection 4 mg (4 mg Intravenous Given 05/29/17 0641)  sodium chloride 0.9 % bolus 1,000 mL (1,000 mLs Intravenous New Bag/Given 05/29/17 0641)     Initial Impression / Assessment and Plan / ED Course  I have reviewed the triage vital signs and the nursing notes.  Pertinent labs & imaging results that were available during my care of the patient were reviewed by me and considered in my medical decision making (see chart for details).  Epigastric pain of uncertain cause.  Consider peptic ulcer disease, GERD, pancreatitis,  cardiac pain, diverticulitis, gastroenteritis.  Old records are reviewed, and she has no relevant past visits.  Screening labs are obtained and she will be sent for CT of abdomen and pelvis.  She will be given morphine for pain.  Additional antiemetics will be withheld unless she has persistent or recurrent nausea.  She had good relief of pain with above-noted treatment.  Laboratory workup shows elevated WBC, but otherwise unremarkable.  CT is pending.  Case is signed out to Dr. Gerhard Perchesardema.  Final Clinical Impressions(s) / ED Diagnoses   Final diagnoses:  Epigastric pain  Non-intractable vomiting with nausea, unspecified vomiting type    ED Discharge Orders    None       Dione BoozeGlick, Nickolas Chalfin, MD 05/29/17 [phone]

## 2017-05-29 NOTE — ED Notes (Signed)
Pt assisted to get dressed in NAD and ready to leave.

## 2017-05-29 NOTE — ED Provider Notes (Signed)
I assumed care of this patient from Dr. Preston FleetingGlick at 0730.  Please see their note for further details of Hx, PE.  Briefly patient is a 52 y.o. female who presented with abdominal pain, nausea, vomiting, diarrhea.  Labs with leukocytosis.  Otherwise grossly reassuring.  Currently pending CT scan.   CT scan unremarkable and without evidence of serious intra-abdominal inflammatory/infectious process or small bowel obstruction.  Patient was treated symptomatically and able to tolerate oral hydration.  The patient appears reasonably screened and/or stabilized for discharge and I doubt any other medical condition or other [facility] requiring further screening, evaluation, or treatment in the ED at this time prior to discharge. The patient is safe for discharge with strict return precautions.  Disposition: Discharge  Condition: Good  I have discussed the results, Dx and Tx plan with the patient who expressed understanding and agree(s) with the plan. Discharge instructions discussed at great length. The patient was given strict return precautions who verbalized understanding of the instructions. No further questions at time of discharge.    ED Discharge Orders        Ordered    ondansetron (ZOFRAN ODT) 4 MG disintegrating tablet  Every 8 hours PRN     05/29/17 1043       Follow Up: Primary care provider  Schedule an appointment as soon as possible for a visit  in 5-7 days, If symptoms do not improve or  worsen      Porfiria Heinrich, Amadeo GarnetPedro Eduardo, MD 05/29/17 1044

## 2017-05-29 NOTE — ED Notes (Signed)
ED Provider at bedside. 

## 2017-05-29 NOTE — ED Notes (Signed)
Pt denies any relief of her pain or nausea. Pt repeatedly demands "something for pain". Explained that we are giving her something to help her nausea so that I can give her the GI cocktail for her stomach pain. Pt states "that doesn't work for me, I need something stronger than that."

## 2017-05-29 NOTE — ED Triage Notes (Signed)
Pt arrives via EMS from home with c/o flu like symptoms and vomiting. En route iv established, 4 mg of zofran given, pressure 160/92, hr 70, temp 99.1.  Pt says starting yesterday, she started having pain in her abdomen and chest associated with vomiting/diarrhea. Denies fevers. No meds PTA.

## 2017-05-30 ENCOUNTER — Inpatient Hospital Stay (HOSPITAL_COMMUNITY)
Admission: EM | Admit: 2017-05-30 | Discharge: 2017-06-04 | DRG: 392 | Disposition: A | Discharge status: Other Acute Inpatient Hospital | Payer: Medicaid Other | Attending: Internal Medicine | Admitting: Internal Medicine

## 2017-05-30 ENCOUNTER — Emergency Department (HOSPITAL_COMMUNITY): Payer: Medicaid Other

## 2017-05-30 ENCOUNTER — Encounter (HOSPITAL_COMMUNITY): Payer: Self-pay | Admitting: Emergency Medicine

## 2017-05-30 ENCOUNTER — Other Ambulatory Visit: Payer: Self-pay

## 2017-05-30 DIAGNOSIS — Z96652 Presence of left artificial knee joint: Secondary | ICD-10-CM | POA: Diagnosis present

## 2017-05-30 DIAGNOSIS — Z888 Allergy status to other drugs, medicaments and biological substances status: Secondary | ICD-10-CM

## 2017-05-30 DIAGNOSIS — G894 Chronic pain syndrome: Secondary | ICD-10-CM | POA: Diagnosis present

## 2017-05-30 DIAGNOSIS — R45851 Suicidal ideations: Secondary | ICD-10-CM

## 2017-05-30 DIAGNOSIS — F332 Major depressive disorder, recurrent severe without psychotic features: Secondary | ICD-10-CM | POA: Diagnosis present

## 2017-05-30 DIAGNOSIS — Z79891 Long term (current) use of opiate analgesic: Secondary | ICD-10-CM

## 2017-05-30 DIAGNOSIS — R197 Diarrhea, unspecified: Secondary | ICD-10-CM

## 2017-05-30 DIAGNOSIS — R1013 Epigastric pain: Secondary | ICD-10-CM

## 2017-05-30 DIAGNOSIS — F4310 Post-traumatic stress disorder, unspecified: Secondary | ICD-10-CM | POA: Diagnosis present

## 2017-05-30 DIAGNOSIS — E876 Hypokalemia: Secondary | ICD-10-CM | POA: Diagnosis present

## 2017-05-30 DIAGNOSIS — A084 Viral intestinal infection, unspecified: Principal | ICD-10-CM | POA: Diagnosis present

## 2017-05-30 DIAGNOSIS — R1110 Vomiting, unspecified: Secondary | ICD-10-CM

## 2017-05-30 DIAGNOSIS — F419 Anxiety disorder, unspecified: Secondary | ICD-10-CM | POA: Diagnosis present

## 2017-05-30 DIAGNOSIS — R531 Weakness: Secondary | ICD-10-CM

## 2017-05-30 DIAGNOSIS — E860 Dehydration: Secondary | ICD-10-CM

## 2017-05-30 DIAGNOSIS — F17210 Nicotine dependence, cigarettes, uncomplicated: Secondary | ICD-10-CM | POA: Diagnosis present

## 2017-05-30 LAB — LIPASE, BLOOD: Lipase: 29 U/L (ref 11–51)

## 2017-05-30 LAB — CBC
HCT: 40.7 % (ref 36.0–46.0)
HCT: 35.7 % — ABNORMAL LOW (ref 36.0–46.0)
HEMOGLOBIN: 13.8 g/dL (ref 12.0–15.0)
HEMOGLOBIN: 11.9 g/dL — AB (ref 12.0–15.0)
MCH: 32.2 pg (ref 26.0–34.0)
MCH: 31.9 pg (ref 26.0–34.0)
MCHC: 33.9 g/dL (ref 30.0–36.0)
MCHC: 33.3 g/dL (ref 30.0–36.0)
MCV: 95.1 fL (ref 78.0–100.0)
MCV: 95.7 fL (ref 78.0–100.0)
Platelets: 291 10*3/uL (ref 150–400)
Platelets: 246 10*3/uL (ref 150–400)
RBC: 4.28 MIL/uL (ref 3.87–5.11)
RBC: 3.73 MIL/uL — AB (ref 3.87–5.11)
RDW: 13.9 % (ref 11.5–15.5)
RDW: 13.8 % (ref 11.5–15.5)
WBC: 11.7 10*3/uL — ABNORMAL HIGH (ref 4.0–10.5)
WBC: 12 10*3/uL — ABNORMAL HIGH (ref 4.0–10.5)

## 2017-05-30 LAB — URINALYSIS, ROUTINE W REFLEX MICROSCOPIC
BILIRUBIN URINE: NEGATIVE
Glucose, UA: NEGATIVE mg/dL
KETONES UR: NEGATIVE mg/dL
Nitrite: NEGATIVE
PH: 6 (ref 5.0–8.0)
Protein, ur: NEGATIVE mg/dL
SPECIFIC GRAVITY, URINE: 1.006 (ref 1.005–1.030)

## 2017-05-30 LAB — RAPID URINE DRUG SCREEN, HOSP PERFORMED
Amphetamines: NOT DETECTED
BENZODIAZEPINES: POSITIVE — AB
Barbiturates: POSITIVE — AB
Cocaine: NOT DETECTED
OPIATES: POSITIVE — AB
Tetrahydrocannabinol: NOT DETECTED

## 2017-05-30 LAB — COMPREHENSIVE METABOLIC PANEL
ALBUMIN: 4.4 g/dL (ref 3.5–5.0)
ALK PHOS: 132 U/L — AB (ref 38–126)
ALT: 16 U/L (ref 14–54)
ANION GAP: 12 (ref 5–15)
AST: 27 U/L (ref 15–41)
BILIRUBIN TOTAL: 0.4 mg/dL (ref 0.3–1.2)
BUN: 5 mg/dL — AB (ref 6–20)
CALCIUM: 9.7 mg/dL (ref 8.9–10.3)
CO2: 22 mmol/L (ref 22–32)
Chloride: 102 mmol/L (ref 101–111)
Creatinine, Ser: 0.79 mg/dL (ref 0.44–1.00)
GFR calc Af Amer: >60 mL/min (ref >60)
GFR calc non Af Amer: >60 mL/min (ref >60)
GLUCOSE: 160 mg/dL — AB (ref 65–99)
Potassium: 2.9 mmol/L — ABNORMAL LOW (ref 3.5–5.1)
SODIUM: 136 mmol/L (ref 135–145)
TOTAL PROTEIN: 8.1 g/dL (ref 6.5–8.1)

## 2017-05-30 LAB — I-STAT TROPONIN, ED: Troponin i, poc: 0 ng/mL (ref 0.00–0.08)

## 2017-05-30 LAB — I-STAT BETA HCG BLOOD, ED (MC, WL, AP ONLY)
I-stat hCG, quantitative: 29 m[IU]/mL — ABNORMAL HIGH (ref <5)
I-stat hCG, quantitative: 28 m[IU]/mL — ABNORMAL HIGH (ref <5)

## 2017-05-30 LAB — CREATININE, SERUM
CREATININE: 0.8 mg/dL (ref 0.44–1.00)
GFR calc Af Amer: >60 mL/min (ref >60)

## 2017-05-30 MED ORDER — ACETAMINOPHEN 650 MG RE SUPP: 650.0000 mg | Freq: Four times a day (QID) | RECTAL | Status: DC | PRN

## 2017-05-30 MED ORDER — OXYCODONE-ACETAMINOPHEN 5-325 MG PO TABS
1.0000 | ORAL_TABLET | Freq: Once | ORAL | Status: AC
Start: 2017-05-30 — End: 2017-05-30
  Administered 2017-05-30: 1 via ORAL
  Filled 2017-05-30: qty 1

## 2017-05-30 MED ORDER — POTASSIUM CHLORIDE 10 MEQ/100ML IV SOLN
10.0000 meq | Freq: Once | INTRAVENOUS | Status: AC
  Administered 2017-05-30: 10 meq via INTRAVENOUS
  Filled 2017-05-30: qty 100

## 2017-05-30 MED ORDER — ENOXAPARIN SODIUM 40 MG/0.4ML ~~LOC~~ SOLN
40.0000 mg | SUBCUTANEOUS | Status: DC
  Administered 2017-05-30 – 2017-06-03 (×4): 40 mg via SUBCUTANEOUS
  Filled 2017-05-30 (×5): qty 0.4

## 2017-05-30 MED ORDER — POTASSIUM CHLORIDE IN NACL 20-0.9 MEQ/L-% IV SOLN
INTRAVENOUS | Status: DC
  Administered 2017-05-30 – 2017-06-02 (×5): via INTRAVENOUS
  Filled 2017-05-30 (×6): qty 1000

## 2017-05-30 MED ORDER — PANTOPRAZOLE SODIUM 40 MG IV SOLR
40.0000 mg | Freq: Once | INTRAVENOUS | Status: AC
  Administered 2017-05-30: 40 mg via INTRAVENOUS
  Filled 2017-05-30: qty 40

## 2017-05-30 MED ORDER — OXYCODONE HCL 5 MG PO TABS
5.0000 mg | ORAL_TABLET | ORAL | Status: DC | PRN
  Administered 2017-05-30 – 2017-05-31 (×2): 5 mg via ORAL
  Filled 2017-05-30 (×2): qty 1

## 2017-05-30 MED ORDER — ONDANSETRON 4 MG PO TBDP
4.0000 mg | ORAL_TABLET | Freq: Once | ORAL | Status: AC | PRN
  Administered 2017-05-30: 4 mg via ORAL
  Filled 2017-05-30: qty 1

## 2017-05-30 MED ORDER — FAMOTIDINE IN NACL 20-0.9 MG/50ML-% IV SOLN
20.0000 mg | Freq: Once | INTRAVENOUS | Status: AC
  Administered 2017-05-30: 20 mg via INTRAVENOUS
  Filled 2017-05-30: qty 50

## 2017-05-30 MED ORDER — ONDANSETRON HCL 4 MG PO TABS: 4.0000 mg | ORAL_TABLET | Freq: Four times a day (QID) | ORAL | Status: DC | PRN

## 2017-05-30 MED ORDER — MORPHINE SULFATE (PF) 4 MG/ML IV SOLN
4.0000 mg | Freq: Once | INTRAVENOUS | Status: AC
  Administered 2017-05-30: 4 mg via INTRAVENOUS
  Filled 2017-05-30: qty 1

## 2017-05-30 MED ORDER — ACETAMINOPHEN 325 MG PO TABS: 650.0000 mg | ORAL_TABLET | Freq: Four times a day (QID) | ORAL | Status: DC | PRN

## 2017-05-30 MED ORDER — CLONAZEPAM 1 MG PO TABS
1.0000 mg | ORAL_TABLET | Freq: Two times a day (BID) | ORAL | Status: DC | PRN
  Administered 2017-05-30 – 2017-06-02 (×6): 1 mg via ORAL
  Filled 2017-05-30 (×6): qty 1

## 2017-05-30 MED ORDER — NICOTINE 21 MG/24HR TD PT24
21.0000 mg | MEDICATED_PATCH | Freq: Every day | TRANSDERMAL | Status: DC
  Administered 2017-05-31 – 2017-06-03 (×5): 21 mg via TRANSDERMAL
  Filled 2017-05-30 (×5): qty 1

## 2017-05-30 MED ORDER — ONDANSETRON HCL 4 MG/2ML IJ SOLN
4.0000 mg | Freq: Four times a day (QID) | INTRAMUSCULAR | Status: DC | PRN
  Administered 2017-05-31: 4 mg via INTRAVENOUS
  Filled 2017-05-30: qty 2

## 2017-05-30 MED ORDER — POTASSIUM CHLORIDE IN NACL 20-0.9 MEQ/L-% IV SOLN
Freq: Once | INTRAVENOUS | Status: AC
  Administered 2017-05-30: 17:00:00 via INTRAVENOUS
  Filled 2017-05-30: qty 1000

## 2017-05-30 MED ORDER — ONDANSETRON 4 MG PO TBDP
4.0000 mg | ORAL_TABLET | Freq: Once | ORAL | Status: AC
  Administered 2017-05-30: 4 mg via ORAL
  Filled 2017-05-30: qty 1

## 2017-05-30 NOTE — ED Notes (Signed)
Pt had drawn for labs:  Red Gold Blue Lavender Lt green Dark green x2 

## 2017-05-30 NOTE — H&P (Signed)
[facility]                                                                                    Patient Demographics  Brooke [name], is a 52 y.o. female  CSN: [id number]  MRN: [id number]  DOB - 07/02/1965  Admit Date - 05/30/2017  Outpatient Primary MD for the patient is Patient, No Pcp Per   With History of -  History reviewed. No pertinent past medical history.    Past Surgical History:  Procedure Laterality Date  . BACK SURGERY    . knee replacement Left     in for   Chief Complaint  Patient presents with  . Abdominal Pain  . Back Pain  . Diarrhea  . Chest Pain     HPI  Brooke [name]  is a 52 y.o. female, with past medical history significant for chronic back pain, status post back surgery in the past presenting with 3 days history of abdominal pain, nausea, vomiting and diarrhea of watery stools, nonbloody.  Patient lives alone and denies being exposed to anybody with GI symptoms.  Reports low-grade fever.  This is her second visit to the emergency room.    Review of Systems    In addition to the HPI above,  No Fever-chills, No Headache, No changes with Vision or hearing, No problems swallowing food or Liquids, No Chest pain, Cough or Shortness of Breath, No Blood in stool or Urine, No dysuria, No new skin rashes or bruises, No new joints pains-aches,  No new weakness, tingling, numbness in any extremity, No recent weight gain or loss,   A full 10 point Review of Systems was done, except as stated above, all other Review of Systems were negative.   Social History Social History   Tobacco Use  . Smoking status: Current Every Day Smoker    Types: Cigarettes  . Smokeless tobacco: Never Used  Substance Use Topics  . Alcohol use: Yes     Family History No family history on file.   Prior to Admission medications   Medication Sig Start Date End Date Taking? Authorizing Provider  clonazePAM (KLONOPIN) 1 MG tablet Take 1 mg by mouth at  bedtime as needed for anxiety.   Yes [provider]  ondansetron (ZOFRAN ODT) 4 MG disintegrating tablet Take 1 tablet (4 mg total) by mouth every 8 (eight) hours as needed for up to 3 days for nausea or vomiting. 05/29/17 06/01/17 Yes Cardama, Amadeo GarnetPedro Eduardo, MD  oxyCODONE-acetaminophen (PERCOCET) 10-325 MG tablet Take 0.5-1 tablets by mouth every 6 (six) hours as needed for pain.   Yes [provider]    Allergies  Allergen Reactions  . Iohexol      Code: HIVES, Desc: dye allergy-throat closes up, SOB, chest pain, Onset Date: [id number]     Physical Exam  Vitals  Blood pressure 140/90, pulse 72, temperature 98.7 F (37.1 C), temperature source Oral, resp. rate 15, SpO2 98 %.   1. General well-developed, well-nourished female, looks tired  2. Normal affect and insight, Not Suicidal or Homicidal, Awake Alert, Oriented X 3.  3. No F.N deficits, grossly, patient moving all extremities.  4.  Ears and Eyes appear Normal, Conjunctivae clear, PERRLA. Moist Oral Mucosa.  5. Supple Neck, No JVD, No cervical lymphadenopathy appriciated, No Carotid Bruits.  6. Symmetrical Chest wall movement, Good air movement bilaterally, CTAB.  7. RRR, No Gallops, Rubs or Murmurs, No Parasternal Heave.  8. Positive Bowel Sounds, Abdomen Soft, Non tender, No organomegaly appriciated,No rebound -guarding or rigidity.  9.  No Cyanosis, Normal Skin Turgor, No Skin Rash or Bruise.  10. Good muscle tone,  joints appear normal , no effusions, Normal ROM.    Data Review  CBC Recent Labs  Lab 05/29/17 0621 05/30/17 1242  WBC 16.3* 11.7*  HGB 13.5 13.8  HCT 40.2 40.7  PLT 331 291  MCV 93.7 95.1  MCH 31.5 32.2  MCHC 33.6 33.9  RDW 13.5 13.9  LYMPHSABS 1.8  --   MONOABS 0.7  --   EOSABS 0.0  --   BASOSABS 0.0  --    ------------------------------------------------------------------------------------------------------------------  Chemistries  Recent Labs  Lab 05/29/17 0617  05/30/17 1242  NA 136 136  K 3.6 2.9*  CL 103 102  CO2 18* 22  GLUCOSE 191* 160*  BUN 9 5*  CREATININE 0.90 0.79  CALCIUM 9.5 9.7  AST 35 27  ALT 15 16  ALKPHOS 126 132*  BILITOT 0.6 0.4   ------------------------------------------------------------------------------------------------------------------ CrCl cannot be calculated (Unknown ideal weight.). ------------------------------------------------------------------------------------------------------------------ No results for input(s): TSH, T4TOTAL, T3FREE, THYROIDAB in the last 72 hours.  Invalid input(s): FREET3   Coagulation profile No results for input(s): INR, PROTIME in the last 168 hours. ------------------------------------------------------------------------------------------------------------------- No results for input(s): DDIMER in the last 72 hours. -------------------------------------------------------------------------------------------------------------------  Cardiac Enzymes Recent Labs  Lab 05/29/17 0617  TROPONINI <0.03   ------------------------------------------------------------------------------------------------------------------ Invalid input(s): POCBNP   ---------------------------------------------------------------------------------------------------------------  Urinalysis    Component Value Date/Time   COLORURINE YELLOW 05/30/2017 1752   APPEARANCEUR CLEAR 05/30/2017 1752   LABSPEC 1.006 05/30/2017 1752   PHURINE 6.0 05/30/2017 1752   GLUCOSEU NEGATIVE 05/30/2017 1752   HGBUR MODERATE (A) 05/30/2017 1752   BILIRUBINUR NEGATIVE 05/30/2017 1752   KETONESUR NEGATIVE 05/30/2017 1752   PROTEINUR NEGATIVE 05/30/2017 1752   UROBILINOGEN 0.2 10/10/2006 1839   NITRITE NEGATIVE 05/30/2017 1752   LEUKOCYTESUR TRACE (A) 05/30/2017 1752    ----------------------------------------------------------------------------------------------------------------   Imaging results:   Ct Abdomen  Pelvis Wo Contrast  Result Date: 05/29/2017 CLINICAL DATA:  Vomiting and diarrhea for 2 days EXAM: CT ABDOMEN AND PELVIS WITHOUT CONTRAST TECHNIQUE: Multidetector CT imaging of the abdomen and pelvis was performed following the standard protocol without IV contrast. COMPARISON:  None. FINDINGS: Lower chest: No acute abnormality. Hepatobiliary: No focal liver abnormality is seen. No gallstones, gallbladder wall thickening, or biliary dilatation. Pancreas: Unremarkable. No pancreatic ductal dilatation or surrounding inflammatory changes. Spleen: Normal in size without focal abnormality. Adrenals/Urinary Tract: Adrenal glands are unremarkable. Kidneys are normal, without renal calculi, focal lesion, or hydronephrosis. Bladder is unremarkable. Stomach/Bowel: Scattered diverticular changes noted without evidence of diverticulitis. The appendix is within normal limits. No obstructive or inflammatory changes are identified. Vascular/Lymphatic: No significant vascular findings are present. No enlarged abdominal or pelvic lymph nodes. Reproductive: Uterus and bilateral adnexa are unremarkable. Other: A small fat containing hernia is noted in the midline just superior to the umbilicus. No bowel incarceration is identified. No abdominopelvic ascites. Musculoskeletal: Mild degenerative changes of lumbar spine are noted. IMPRESSION: Diverticulosis without evidence of diverticulitis. Small fat containing hernia anteriorly. No acute abnormality is noted within the abdomen. Electronically Signed   By: Eulah Pont.D.  On: 05/29/2017 07:55   Dg Chest 2 View  Result Date: 05/30/2017 CLINICAL DATA:  Chest congestion and tightness, nausea, vomiting, and mid abdominal pain. No cough. Current smoker. EXAM: CHEST - 2 VIEW COMPARISON:  Portable chest x-ray of March 01, 2006 FINDINGS: The lungs are well-expanded. The interstitial markings are coarse. There is no alveolar infiltrate or pleural effusion. The heart and pulmonary  vascularity are normal. The mediastinum is normal in width. The trachea is midline. There is gentle dextrocurvature centered in the lower thoracic spine. IMPRESSION: COPD-smoking related changes.  No pneumonia nor CHF. The observed portions of the upper abdomen are normal. Electronically Signed   By: David  [place] M.D.   On: 05/30/2017 12:59   US Abdomen Limited  Result Date: 05/30/2017 CLINICAL DATA:  Epigastric pain, chest pain for 3 days EXAM: ULTRASOUND ABDOMEN LIMITED RIGHT UPPER QUADRANT COMPARISON:  CT abdomen 05/29/2017 FINDINGS: Gallbladder: No gallstones or wall thickening visualized. No sonographic Murphy sign noted by sonographer. Common bile duct: Diameter: 4.4 mm Liver: No focal lesion identified. Within normal limits in parenchymal echogenicity. Portal vein is patent on color Doppler imaging with normal direction of blood flow towards the liver. IMPRESSION: Normal right upper quadrant ultrasound. Electronically Signed   By: Elige Ko   On: 05/30/2017 17:58    My personal review of EKG:     Assessment & Plan  1.  Gastroenteritis, 2.   Dehydration 3.  Hypokalemia 4.  Chronic pain with history of back surgery  Plan  Hydration Replete potassium Continue with pain medications Check stool studies  DVT Prophylaxis Lovenox  AM Labs Ordered, also please review Full Orders    Code Status full  Disposition Plan: Home  Time spent in minutes : 41 minutes  Condition fair   @SIGNATURE @

## 2017-05-30 NOTE — ED Provider Notes (Signed)
[facility]-EMERGENCY DEPT Provider Note   CSN: [id number] Arrival date & time: 05/30/17  1228     History   Chief Complaint Chief Complaint  Patient presents with  . Abdominal Pain  . Back Pain  . Diarrhea  . Chest Pain    HPI Brooke [name] is a 52 y.o. female.  HPI Patient reports diarrhea and vomiting for about 3 days.  She reports today she has had incontinence of stool.  Is brown and watery.  She has not seen blood.  She reports that she tries to eat or drink anything she vomits.  She has not documented fever at home.  She reports she had low-grade temperature yesterday when she was seen in the emergency department.  She reports she is having epigastric pain sharp in nature that radiates up into her chest and sometimes has burning quality at the base of her throat.  She reports when she vomits it feels like she is bringing up "stomach acid".  Patient denies any frequent problems with diarrheal illnesses or abdominal pain.  She does report she has problems with chronic back pain and has had prior procedures and deteriorating disks.  Her pain is typically managed on as needed Percocet. Patient denies any sick contacts.  She has not had any recent antibiotics.  He does not work in a [employer] environment.  She does not visit nursing homes or daycare environment.  Patient reports he typically eats at home and does not eat out.  No travel history. History reviewed. No pertinent past medical history.  There are no active problems to display for this patient.   Past Surgical History:  Procedure Laterality Date  . BACK SURGERY    . knee replacement Left     OB History    No data available       Home Medications    Prior to Admission medications   Medication Sig Start Date End Date Taking? Authorizing Provider  clonazePAM (KLONOPIN) 1 MG tablet Take 1 mg by mouth at bedtime as needed for anxiety.   Yes [provider]  ondansetron (ZOFRAN ODT)  4 MG disintegrating tablet Take 1 tablet (4 mg total) by mouth every 8 (eight) hours as needed for up to 3 days for nausea or vomiting. 05/29/17 06/01/17 Yes Cardama, Amadeo GarnetPedro Eduardo, MD  oxyCODONE-acetaminophen (PERCOCET) 10-325 MG tablet Take 0.5-1 tablets by mouth every 6 (six) hours as needed for pain.   Yes [provider]    Family History No family history on file.  Social History Social History   Tobacco Use  . Smoking status: Current Every Day Smoker    Types: Cigarettes  . Smokeless tobacco: Never Used  Substance Use Topics  . Alcohol use: Yes  . Drug use: No     Allergies   Iohexol   Review of Systems Review of Systems  10 Systems reviewed and are negative for acute change except as noted in the HPI.  Physical Exam Updated Vital Signs BP 140/90   Pulse 72   Temp 98.7 F (37.1 C) (Oral)   Resp 15   SpO2 98%   Physical Exam  Constitutional: She is oriented to person, place, and time. She appears well-developed and well-nourished. No distress.  HENT:  Head: Normocephalic and atraumatic.  Mouth/Throat: Oropharynx is clear and moist.  Eyes: Conjunctivae and EOM are normal.  Neck: Neck supple.  Cardiovascular: Normal rate and regular rhythm.  No murmur heard. Pulmonary/Chest: Effort normal and breath sounds  normal. No respiratory distress.  Abdominal: Soft. She exhibits no distension. There is tenderness. There is no guarding.  Epigastrium tender to palpation.  No guarding.  Lower abdomen nontender.  Musculoskeletal: Normal range of motion. She exhibits no edema or tenderness.  Neurological: She is alert and oriented to person, place, and time. No cranial nerve deficit. She exhibits normal muscle tone. Coordination normal.  Skin: Skin is warm and dry.  Psychiatric: She has a normal mood and affect.  Nursing note and vitals reviewed.    ED Treatments / Results  Labs (all labs ordered are listed, but only abnormal results are displayed) Labs  Reviewed  CBC - Abnormal; Notable for the following components:      Result Value   WBC 11.7 (*)    All other components within normal limits  COMPREHENSIVE METABOLIC PANEL - Abnormal; Notable for the following components:   Potassium 2.9 (*)    Glucose, Bld 160 (*)    BUN 5 (*)    Alkaline Phosphatase 132 (*)    All other components within normal limits  URINALYSIS, ROUTINE W REFLEX MICROSCOPIC - Abnormal; Notable for the following components:   Hgb urine dipstick MODERATE (*)    Leukocytes, UA TRACE (*)    Bacteria, UA RARE (*)    Squamous Epithelial / LPF 0-5 (*)    All other components within normal limits  RAPID URINE DRUG SCREEN, HOSP PERFORMED - Abnormal; Notable for the following components:   Opiates POSITIVE (*)    Benzodiazepines POSITIVE (*)    Barbiturates POSITIVE (*)    All other components within normal limits  I-STAT BETA HCG BLOOD, ED ([facility], [facility], [facility] ONLY) - Abnormal; Notable for the following components:   I-stat hCG, quantitative 29.0 (*)    All other components within normal limits  I-STAT BETA HCG BLOOD, ED ([facility], [facility], [facility] ONLY) - Abnormal; Notable for the following components:   I-stat hCG, quantitative 28.0 (*)    All other components within normal limits  GASTROINTESTINAL PANEL BY PCR, STOOL (REPLACES STOOL CULTURE)  C DIFFICILE QUICK SCREEN W PCR REFLEX  LIPASE, BLOOD  I-STAT TROPONIN, ED    EKG  EKG Interpretation  Date/Time:  Wednesday May 30 2017 12:41:04 EDT Ventricular Rate:  103 PR Interval:    QRS Duration: 134 QT Interval:  408 QTC Calculation: 535 R Axis:   -160 Text Interpretation:  Sinus rhythm some baseline artifact. no acute ischemic appearance. no sig change from previous Confirmed by Arby Barrette [phone]) on 05/30/2017 4:03:40 PM       Radiology Ct Abdomen Pelvis Wo Contrast  Result Date: 05/29/2017 CLINICAL DATA:  Vomiting and diarrhea for 2 days EXAM: CT ABDOMEN AND PELVIS WITHOUT CONTRAST TECHNIQUE: Multidetector CT imaging  of the abdomen and pelvis was performed following the standard protocol without IV contrast. COMPARISON:  None. FINDINGS: Lower chest: No acute abnormality. Hepatobiliary: No focal liver abnormality is seen. No gallstones, gallbladder wall thickening, or biliary dilatation. Pancreas: Unremarkable. No pancreatic ductal dilatation or surrounding inflammatory changes. Spleen: Normal in size without focal abnormality. Adrenals/Urinary Tract: Adrenal glands are unremarkable. Kidneys are normal, without renal calculi, focal lesion, or hydronephrosis. Bladder is unremarkable. Stomach/Bowel: Scattered diverticular changes noted without evidence of diverticulitis. The appendix is within normal limits. No obstructive or inflammatory changes are identified. Vascular/Lymphatic: No significant vascular findings are present. No enlarged abdominal or pelvic lymph nodes. Reproductive: Uterus and bilateral adnexa are unremarkable. Other: A small fat containing hernia is noted in the midline just superior  to the umbilicus. No bowel incarceration is identified. No abdominopelvic ascites. Musculoskeletal: Mild degenerative changes of lumbar spine are noted. IMPRESSION: Diverticulosis without evidence of diverticulitis. Small fat containing hernia anteriorly. No acute abnormality is noted within the abdomen. Electronically Signed   By: Alcide Clever M.D.   On: 05/29/2017 07:55   Dg Chest 2 View  Result Date: 05/30/2017 CLINICAL DATA:  Chest congestion and tightness, nausea, vomiting, and mid abdominal pain. No cough. Current smoker. EXAM: CHEST - 2 VIEW COMPARISON:  Portable chest x-ray of March 01, 2006 FINDINGS: The lungs are well-expanded. The interstitial markings are coarse. There is no alveolar infiltrate or pleural effusion. The heart and pulmonary vascularity are normal. The mediastinum is normal in width. The trachea is midline. There is gentle dextrocurvature centered in the lower thoracic spine. IMPRESSION: COPD-smoking  related changes.  No pneumonia nor CHF. The observed portions of the upper abdomen are normal. Electronically Signed   By: David  [place] M.D.   On: 05/30/2017 12:59   US Abdomen Limited  Result Date: 05/30/2017 CLINICAL DATA:  Epigastric pain, chest pain for 3 days EXAM: ULTRASOUND ABDOMEN LIMITED RIGHT UPPER QUADRANT COMPARISON:  CT abdomen 05/29/2017 FINDINGS: Gallbladder: No gallstones or wall thickening visualized. No sonographic Murphy sign noted by sonographer. Common bile duct: Diameter: 4.4 mm Liver: No focal lesion identified. Within normal limits in parenchymal echogenicity. Portal vein is patent on color Doppler imaging with normal direction of blood flow towards the liver. IMPRESSION: Normal right upper quadrant ultrasound. Electronically Signed   By: Elige Ko   On: 05/30/2017 17:58    Procedures Procedures (including critical care time)  Medications Ordered in ED Medications  ondansetron (ZOFRAN-ODT) disintegrating tablet 4 mg (4 mg Oral Given 05/30/17 1714)  potassium chloride 10 mEq in 100 mL IVPB (0 mEq Intravenous Stopped 05/30/17 1848)  0.9 % NaCl with KCl 20 mEq/ L  infusion ( Intravenous New Bag/Given 05/30/17 1719)  pantoprazole (PROTONIX) injection 40 mg (40 mg Intravenous Given 05/30/17 1726)  famotidine (PEPCID) IVPB 20 mg premix (0 mg Intravenous Stopped 05/30/17 1753)  morphine 4 MG/ML injection 4 mg (4 mg Intravenous Given 05/30/17 1714)  oxyCODONE-acetaminophen (PERCOCET/ROXICET) 5-325 MG per tablet 1 tablet (1 tablet Oral Given 05/30/17 1830)  ondansetron (ZOFRAN-ODT) disintegrating tablet 4 mg (4 mg Oral Given 05/30/17 1830)     Initial Impression / Assessment and Plan / ED Course  I have reviewed the triage vital signs and the nursing notes.  Pertinent labs & imaging results that were available during my care of the patient were reviewed by me and considered in my medical decision making (see chart for details).     Consult: [facility] for  admission Final Clinical Impressions(s) / ED Diagnoses   Final diagnoses:  Epigastric pain  Vomiting and diarrhea  Hypokalemia  Generalized weakness   Has had severe diarrhea.  She has had incontinence of stool.  She describes vomiting as well.  Patient is found to be hypokalemic.  Rehydration and symptom management initiated.  Patient had same symptoms and been seen in the emergency department 1 and half days earlier.  CT did not show any acute findings.  I did also proceed with ultrasound for gallbladder as patient has significant amount of epigastric and upper abdominal pain.  No acute findings identified.  Patient did continue to feel very nauseated and weak after rehydration.  Symptoms most likely gastroenteritis with dehydration and hypokalemia.  Plan for observation for continued hydration and symptom control. ED Discharge Orders  None       Arby Barrette, MD 06/03/17 [phone]

## 2017-05-30 NOTE — ED Triage Notes (Signed)
Pt reports abd pain, back pain, chest pain causing her to feel like she can't breath and  n/v/d for 2 days. [facility] and was told stomach bug but patient knows it is not a stomach bug. Reports that she has incontinence of her bowels as well. Denies any urinary problems.

## 2017-05-30 NOTE — ED Notes (Signed)
ED TO INPATIENT HANDOFF REPORT  Name/Age/Gender Brooke [name] 52 y.o. female  Code Status Code Status History    This patient does not have a recorded code status. Please follow your organizational policy for patients in this situation.      Home/SNF/Other Home  Chief Complaint SOB; Chest Pain; Emesis  Level of Care/Admitting Diagnosis ED Disposition    ED Disposition Condition Comment   Admit  Hospital Area: [facility] [[id number]]  Level of Care: Med-Surg [16]  Diagnosis: Dehydration [276.51.ICD-9-CM]  Admitting Physician: Laren Everts, [place] Marshal.[place]  Attending Physician: Laren Everts, ALI [[id number]]  PT Class (Do Not Modify): Observation [104]  PT Acc Code (Do Not Modify): Observation [10022]       Medical History History reviewed. No pertinent past medical history.  Allergies Allergies  Allergen Reactions  . Iohexol      Code: HIVES, Desc: dye allergy-throat closes up, SOB, chest pain, Onset Date: [id number]     IV Location/Drains/Wounds Patient Lines/Drains/Airways Status   Active Line/Drains/Airways    Name:   Placement date:   Placement time:   Site:   Days:   Peripheral IV 05/30/17 Right Hand   05/30/17    1631    Hand   less than 1   Peripheral IV 05/30/17 Left Arm   05/30/17    1707    Arm   less than 1          Labs/Imaging Results for orders placed or performed during the hospital encounter of 05/30/17 (from the past 48 hour(s))  CBC     Status: Abnormal   Collection Time: 05/30/17 12:42 PM  Result Value Ref Range   WBC 11.7 (H) 4.0 - 10.5 K/uL   RBC 4.28 3.87 - 5.11 MIL/uL   Hemoglobin 13.8 12.0 - 15.0 g/dL   HCT 40.7 36.0 - 46.0 %   MCV 95.1 78.0 - 100.0 fL   MCH 32.2 26.0 - 34.0 pg   MCHC 33.9 30.0 - 36.0 g/dL   RDW 13.9 11.5 - 15.5 %   Platelets 291 150 - 400 K/uL    Comment: Performed at [facility], [place] [address], [place], [place] [address]  Lipase, blood     Status: None   Collection Time: 05/30/17 12:42 PM   Result Value Ref Range   Lipase 29 11 - 51 U/L    Comment: Performed at [facility], [place] [address], [place], [place] [address]  Comprehensive metabolic panel     Status: Abnormal   Collection Time: 05/30/17 12:42 PM  Result Value Ref Range   Sodium 136 135 - 145 mmol/L   Potassium 2.9 (L) 3.5 - 5.1 mmol/L   Chloride 102 101 - 111 mmol/L   CO2 22 22 - 32 mmol/L   Glucose, Bld 160 (H) 65 - 99 mg/dL   BUN 5 (L) 6 - 20 mg/dL   Creatinine, Ser 0.79 0.44 - 1.00 mg/dL   Calcium 9.7 8.9 - 10.3 mg/dL   Total Protein 8.1 6.5 - 8.1 g/dL   Albumin 4.4 3.5 - 5.0 g/dL   AST 27 15 - 41 U/L   ALT 16 14 - 54 U/L   Alkaline Phosphatase 132 (H) 38 - 126 U/L   Total Bilirubin 0.4 0.3 - 1.2 mg/dL   GFR calc non Af Amer >60 >60 mL/min   GFR calc Af Amer >60 >60 mL/min    Comment: (NOTE) The eGFR has been calculated using the CKD EPI equation. This  calculation has not been validated in all clinical situations. eGFR's persistently <60 mL/min signify possible Chronic Kidney Disease.    Anion gap 12 5 - 15    Comment: Performed at [facility], [place] [address] [place], [place] [address]  I-stat troponin, ED     Status: None   Collection Time: 05/30/17  1:13 PM  Result Value Ref Range   Troponin i, poc 0.00 0.00 - 0.08 ng/mL   Comment 3            Comment: Due to the release kinetics of cTnI, a negative result within the first hours of the onset of symptoms does not rule out myocardial infarction with certainty. If myocardial infarction is still suspected, repeat the test at appropriate intervals.   I-Stat beta hCG blood, ED     Status: Abnormal   Collection Time: 05/30/17  1:14 PM  Result Value Ref Range   I-stat hCG, quantitative 29.0 (H) <5 mIU/mL   Comment 3            Comment:   GEST. AGE      CONC.  (mIU/mL)   <=1 WEEK        5 - 50     2 WEEKS       50 - 500     3 WEEKS       100 - 10,000     4 WEEKS     1,000 - 30,000        FEMALE AND  NON-PREGNANT FEMALE:     LESS THAN 5 mIU/mL   I-Stat beta hCG blood, ED     Status: Abnormal   Collection Time: 05/30/17  4:50 PM  Result Value Ref Range   I-stat hCG, quantitative 28.0 (H) <5 mIU/mL   Comment 3            Comment:   GEST. AGE      CONC.  (mIU/mL)   <=1 WEEK        5 - 50     2 WEEKS       50 - 500     3 WEEKS       100 - 10,000     4 WEEKS     1,000 - 30,000        FEMALE AND NON-PREGNANT FEMALE:     LESS THAN 5 mIU/mL   Urinalysis, Routine w reflex microscopic     Status: Abnormal   Collection Time: 05/30/17  5:52 PM  Result Value Ref Range   Color, Urine YELLOW YELLOW   APPearance CLEAR CLEAR   Specific Gravity, Urine 1.006 1.005 - 1.030   pH 6.0 5.0 - 8.0   Glucose, UA NEGATIVE NEGATIVE mg/dL   Hgb urine dipstick MODERATE (A) NEGATIVE   Bilirubin Urine NEGATIVE NEGATIVE   Ketones, ur NEGATIVE NEGATIVE mg/dL   Protein, ur NEGATIVE NEGATIVE mg/dL   Nitrite NEGATIVE NEGATIVE   Leukocytes, UA TRACE (A) NEGATIVE   RBC / HPF 6-30 0 - 5 RBC/hpf   WBC, UA 0-5 0 - 5 WBC/hpf   Bacteria, UA RARE (A) NONE SEEN   Squamous Epithelial / LPF 0-5 (A) NONE SEEN   Mucus PRESENT     Comment: Performed at [facility], [place] [address] [place], [place] [address]  Urine rapid drug screen (hosp performed)     Status: Abnormal   Collection Time: 05/30/17  5:52 PM  Result Value Ref Range   Opiates POSITIVE (  A) NONE DETECTED   Cocaine NONE DETECTED NONE DETECTED   Benzodiazepines POSITIVE (A) NONE DETECTED   Amphetamines NONE DETECTED NONE DETECTED   Tetrahydrocannabinol NONE DETECTED NONE DETECTED   Barbiturates POSITIVE (A) NONE DETECTED    Comment: (NOTE) DRUG SCREEN FOR MEDICAL PURPOSES ONLY.  IF CONFIRMATION IS NEEDED FOR ANY PURPOSE, NOTIFY LAB WITHIN 5 DAYS. LOWEST DETECTABLE LIMITS FOR URINE DRUG SCREEN Drug Class                     Cutoff (ng/mL) Amphetamine and metabolites    1000 Barbiturate and metabolites    200 Benzodiazepine                  629 Tricyclics and metabolites     300 Opiates and metabolites        300 Cocaine and metabolites        300 THC                            50 Performed at [facility], [place] [address], [place], [place] [address]    Ct Abdomen Pelvis Wo Contrast  Result Date: 05/29/2017 CLINICAL DATA:  Vomiting and diarrhea for 2 days EXAM: CT ABDOMEN AND PELVIS WITHOUT CONTRAST TECHNIQUE: Multidetector CT imaging of the abdomen and pelvis was performed following the standard protocol without IV contrast. COMPARISON:  None. FINDINGS: Lower chest: No acute abnormality. Hepatobiliary: No focal liver abnormality is seen. No gallstones, gallbladder wall thickening, or biliary dilatation. Pancreas: Unremarkable. No pancreatic ductal dilatation or surrounding inflammatory changes. Spleen: Normal in size without focal abnormality. Adrenals/Urinary Tract: Adrenal glands are unremarkable. Kidneys are normal, without renal calculi, focal lesion, or hydronephrosis. Bladder is unremarkable. Stomach/Bowel: Scattered diverticular changes noted without evidence of diverticulitis. The appendix is within normal limits. No obstructive or inflammatory changes are identified. Vascular/Lymphatic: No significant vascular findings are present. No enlarged abdominal or pelvic lymph nodes. Reproductive: Uterus and bilateral adnexa are unremarkable. Other: A small fat containing hernia is noted in the midline just superior to the umbilicus. No bowel incarceration is identified. No abdominopelvic ascites. Musculoskeletal: Mild degenerative changes of lumbar spine are noted. IMPRESSION: Diverticulosis without evidence of diverticulitis. Small fat containing hernia anteriorly. No acute abnormality is noted within the abdomen. Electronically Signed   By: Inez Catalina M.D.   On: 05/29/2017 07:55   Dg Chest 2 View  Result Date: 05/30/2017 CLINICAL DATA:  Chest congestion and tightness, nausea, vomiting, and mid abdominal  pain. No cough. Current smoker. EXAM: CHEST - 2 VIEW COMPARISON:  Portable chest x-ray of March 01, 2006 FINDINGS: The lungs are well-expanded. The interstitial markings are coarse. There is no alveolar infiltrate or pleural effusion. The heart and pulmonary vascularity are normal. The mediastinum is normal in width. The trachea is midline. There is gentle dextrocurvature centered in the lower thoracic spine. IMPRESSION: COPD-smoking related changes.  No pneumonia nor CHF. The observed portions of the upper abdomen are normal. Electronically Signed   By: David  Martinique M.D.   On: 05/30/2017 12:59   US Abdomen Limited  Result Date: 05/30/2017 CLINICAL DATA:  Epigastric pain, chest pain for 3 days EXAM: ULTRASOUND ABDOMEN LIMITED RIGHT UPPER QUADRANT COMPARISON:  CT abdomen 05/29/2017 FINDINGS: Gallbladder: No gallstones or wall thickening visualized. No sonographic Murphy sign noted by sonographer. Common bile duct: Diameter: 4.4 mm Liver: No focal lesion identified. Within normal limits in parenchymal echogenicity. Portal vein is patent on  color Doppler imaging with normal direction of blood flow towards the liver. IMPRESSION: Normal right upper quadrant ultrasound. Electronically Signed   By: Kathreen Devoid   On: 05/30/2017 17:58    Pending Labs Unresulted Labs (From admission, onward)   Start     Ordered   05/30/17 1858  C difficile quick scan w PCR reflex  (C Difficile quick screen w PCR reflex panel)  Once, for 48 hours,   R    Comments:  Laxatives (last 72 hours)   None     Question Answer Comment  Is your patient experiencing loose or watery stools (3 or more in 24 hours)? Yes   Has the patient received laxatives in the last 24 hours? No   Has a negative Cdiff test resulted in the last 7 days? No      05/30/17 1858   05/30/17 1857  Gastrointestinal Panel by PCR , Stool  (Gastrointestinal Panel by PCR, Stool)  Once,   R     05/30/17 1858   Signed and Held  HIV antibody (Routine Testing)   Once,   R     Signed and Held   Signed and Held  CBC  (enoxaparin (LOVENOX)    CrCl >/= 30 ml/min)  Once,   R    Comments:  Baseline for enoxaparin therapy IF NOT ALREADY DRAWN.  Notify MD if PLT < 100 K.    Signed and Held   Signed and Held  Creatinine, serum  (enoxaparin (LOVENOX)    CrCl >/= 30 ml/min)  Once,   R    Comments:  Baseline for enoxaparin therapy IF NOT ALREADY DRAWN.    Signed and Held   Signed and Held  Creatinine, serum  (enoxaparin (LOVENOX)    CrCl >/= 30 ml/min)  Weekly,   R    Comments:  while on enoxaparin therapy    Signed and Held   Signed and Held  Basic metabolic panel  Tomorrow morning,   R     Signed and Held      Vitals/Pain Today's Vitals   05/30/17 1900 05/30/17 1930 05/30/17 2000 05/30/17 2011  BP: (!) 138/92 122/81 111/82   Pulse: 80 75 72   Resp: _0 Temp:      TempSrc:      SpO2: 93% 93% 93%   PainSc:    5     Isolation Precautions Enteric precautions (UV disinfection)  Medications Medications  ondansetron (ZOFRAN-ODT) disintegrating tablet 4 mg (4 mg Oral Given 05/30/17 1714)  potassium chloride 10 mEq in 100 mL IVPB (0 mEq Intravenous Stopped 05/30/17 1848)  0.9 % NaCl with KCl 20 mEq/ L  infusion ( Intravenous New Bag/Given 05/30/17 1719)  pantoprazole (PROTONIX) injection 40 mg (40 mg Intravenous Given 05/30/17 1726)  famotidine (PEPCID) IVPB 20 mg premix (0 mg Intravenous Stopped 05/30/17 1753)  morphine 4 MG/ML injection 4 mg (4 mg Intravenous Given 05/30/17 1714)  oxyCODONE-acetaminophen (PERCOCET/ROXICET) 5-325 MG per tablet 1 tablet (1 tablet Oral Given 05/30/17 1830)  ondansetron (ZOFRAN-ODT) disintegrating tablet 4 mg (4 mg Oral Given 05/30/17 1830)    Mobility walks

## 2017-05-31 DIAGNOSIS — F17210 Nicotine dependence, cigarettes, uncomplicated: Secondary | ICD-10-CM

## 2017-05-31 DIAGNOSIS — R450 Nervousness: Secondary | ICD-10-CM

## 2017-05-31 DIAGNOSIS — F419 Anxiety disorder, unspecified: Secondary | ICD-10-CM

## 2017-05-31 DIAGNOSIS — F1390 Sedative, hypnotic, or anxiolytic use, unspecified, uncomplicated: Secondary | ICD-10-CM

## 2017-05-31 DIAGNOSIS — G4700 Insomnia, unspecified: Secondary | ICD-10-CM

## 2017-05-31 DIAGNOSIS — K529 Noninfective gastroenteritis and colitis, unspecified: Secondary | ICD-10-CM

## 2017-05-31 DIAGNOSIS — Z818 Family history of other mental and behavioral disorders: Secondary | ICD-10-CM

## 2017-05-31 DIAGNOSIS — F332 Major depressive disorder, recurrent severe without psychotic features: Secondary | ICD-10-CM | POA: Diagnosis present

## 2017-05-31 DIAGNOSIS — Z630 Problems in relationship with spouse or partner: Secondary | ICD-10-CM

## 2017-05-31 LAB — BASIC METABOLIC PANEL
Anion gap: 8 (ref 5–15)
BUN: 8 mg/dL (ref 6–20)
CALCIUM: 8.7 mg/dL — AB (ref 8.9–10.3)
CO2: 22 mmol/L (ref 22–32)
CREATININE: 0.74 mg/dL (ref 0.44–1.00)
Chloride: 109 mmol/L (ref 101–111)
Glucose, Bld: 90 mg/dL (ref 65–99)
Potassium: 3.6 mmol/L (ref 3.5–5.1)
SODIUM: 139 mmol/L (ref 135–145)

## 2017-05-31 LAB — CBC
HCT: 36.2 % (ref 36.0–46.0)
Hemoglobin: 11.6 g/dL — ABNORMAL LOW (ref 12.0–15.0)
MCH: 31.6 pg (ref 26.0–34.0)
MCHC: 32 g/dL (ref 30.0–36.0)
MCV: 98.6 fL (ref 78.0–100.0)
Platelets: 251 10*3/uL (ref 150–400)
RBC: 3.67 MIL/uL — ABNORMAL LOW (ref 3.87–5.11)
RDW: 14.4 % (ref 11.5–15.5)
WBC: 9.5 10*3/uL (ref 4.0–10.5)

## 2017-05-31 LAB — MAGNESIUM: Magnesium: 2 mg/dL (ref 1.7–2.4)

## 2017-05-31 LAB — TROPONIN I: Troponin I: <0.03 ng/mL (ref <0.03)

## 2017-05-31 LAB — HIV ANTIBODY (ROUTINE TESTING W REFLEX): HIV Screen 4th Generation wRfx: NONREACTIVE

## 2017-05-31 MED ORDER — ALUM & MAG HYDROXIDE-SIMETH 200-200-20 MG/5ML PO SUSP
30.0000 mL | Freq: Four times a day (QID) | ORAL | Status: DC | PRN
  Administered 2017-05-31: 30 mL via ORAL
  Filled 2017-05-31: qty 30

## 2017-05-31 MED ORDER — PANTOPRAZOLE SODIUM 40 MG PO TBEC
40.0000 mg | DELAYED_RELEASE_TABLET | Freq: Every day | ORAL | Status: DC
  Administered 2017-05-31 – 2017-06-04 (×5): 40 mg via ORAL
  Filled 2017-05-31 (×5): qty 1

## 2017-05-31 MED ORDER — ADULT MULTIVITAMIN W/MINERALS CH
1.0000 | ORAL_TABLET | Freq: Every day | ORAL | Status: DC
  Administered 2017-06-01 – 2017-06-04 (×4): 1 via ORAL
  Filled 2017-05-31 (×4): qty 1

## 2017-05-31 MED ORDER — POTASSIUM CHLORIDE CRYS ER 20 MEQ PO TBCR
20.0000 meq | EXTENDED_RELEASE_TABLET | Freq: Once | ORAL | Status: AC
  Administered 2017-05-31: 20 meq via ORAL
  Filled 2017-05-31: qty 1

## 2017-05-31 MED ORDER — MORPHINE SULFATE ER 15 MG PO TBCR
15.0000 mg | EXTENDED_RELEASE_TABLET | Freq: Two times a day (BID) | ORAL | Status: DC
  Administered 2017-05-31 – 2017-06-04 (×9): 15 mg via ORAL
  Filled 2017-05-31 (×9): qty 1

## 2017-05-31 MED ORDER — OXYCODONE-ACETAMINOPHEN 5-325 MG PO TABS
1.0000 | ORAL_TABLET | Freq: Four times a day (QID) | ORAL | Status: DC | PRN
  Administered 2017-05-31 – 2017-06-02 (×9): 1 via ORAL
  Filled 2017-05-31 (×9): qty 1

## 2017-05-31 MED ORDER — BOOST / RESOURCE BREEZE PO LIQD CUSTOM
1.0000 | Freq: Two times a day (BID) | ORAL | Status: DC
  Administered 2017-06-01 – 2017-06-04 (×6): 1 via ORAL

## 2017-05-31 MED ORDER — OXYCODONE HCL 5 MG PO TABS
5.0000 mg | ORAL_TABLET | Freq: Four times a day (QID) | ORAL | Status: DC | PRN
  Administered 2017-05-31 – 2017-06-02 (×9): 5 mg via ORAL
  Filled 2017-05-31 (×9): qty 1

## 2017-05-31 NOTE — Progress Notes (Signed)
PROGRESS NOTE    Unknown Jimmyee M Logan  EXB:[id number] DOB: 07/02/1965 DOA: 05/30/2017 PCP: Patient, No Pcp Per  Outpatient Specialists:   Brief Narrative: Florencia Reasonsmyee Stieber  is a 52 y.o. female, with past medical history significant for chronic back pain, status post back surgery in the past presenting with 3 days history of abdominal pain, nausea, vomiting and diarrhea of watery stools, nonbloody.  Patient lives alone and denies being exposed to anybody with GI symptoms.  Reports low-grade fever.  This is her second visit to the emergency room.  05/31/2017: Patient's GI symptoms have resolved.  Patient is medically stable for discharge from medical point.  Prior to discharge, collateral information reveals that the patient was voicing suicidal ideations.  Psychiatry team has been consulted.  Disposition will depend on psychiatry recommendations.  Otherwise, no new symptoms reported.  Assessment & Plan:   Principal Problem:   MDD (major depressive disorder), recurrent severe, without psychosis (HCC) Active Problems:   Dehydration  1.  Gastroenteritis, resolved. 2.   Dehydration, resolved. 3.  Hypokalemia, repleted. 4.  Chronic pain with history of back surgery, stable. Suicidal ideations-psychiatry consulted.  Plan: -The patient has been medically optimized. -Disposition will depend on psychiatry recommendation. -Continue current management  DVT prophylaxis: Lovenox Code Status: Full Family Communication: None Disposition Plan: Depends on psychiatric input   Consultants:   Psychiatric  Procedures:   None  Antimicrobials:   None   Subjective: No new complaints.  Nausea vomiting and diarrhea have resolved.  Patient reported suicidal ideations as per collateral information.  Objective: Vitals:   05/30/17 2000 05/30/17 2056 05/31/17 0629 05/31/17 1232  BP: 111/82 126/86 115/78 128/72  Pulse: 72 80 67 72  Resp: 16 16 16 17   Temp:  98.8 F (37.1 C) 98.5 F (36.9 C) 98.9 F  (37.2 C)  TempSrc:  Oral Oral Oral  SpO2: 93% 98% 94% 97%  Weight:  66 kg (145 lb 8.1 oz)    Height:  5\' 3"  (1.6 m)      Intake/Output Summary (Last 24 hours) at 05/31/2017 2001 Last data filed at 05/31/2017 1901 Gross per 24 hour  Intake 1611.25 ml  Output -  Net 1611.25 ml   Filed Weights   05/30/17 2056  Weight: 66 kg (145 lb 8.1 oz)    Examination:  General exam: Appears calm and comfortable  Respiratory system: Clear to auscultation. Respiratory effort normal. Cardiovascular system: S1 & S2. No pedal edema. Gastrointestinal system: Abdomen is nondistended, soft and nontender. No organomegaly or masses felt. Normal bowel sounds heard. Central nervous system: Alert and oriented. No focal neurological deficits. Extremities: Symmetric 5 x 5 power. Psychiatry: Judgement and insight appear normal. Mood & affect appropriate.     Data Reviewed: I have personally reviewed following labs and imaging studies  CBC: Recent Labs  Lab 05/29/17 0621 05/30/17 1242 05/30/17 2123 05/31/17 0549  WBC 16.3* 11.7* 12.0* 9.5  NEUTROABS 13.8*  --   --   --   HGB 13.5 13.8 11.9* 11.6*  HCT 40.2 40.7 35.7* 36.2  MCV 93.7 95.1 95.7 98.6  PLT 331 291 246 251   Basic Metabolic Panel: Recent Labs  Lab 05/29/17 0617 05/30/17 1242 05/30/17 2123 05/31/17 0549  NA 136 136  --  139  K 3.6 2.9*  --  3.6  CL 103 102  --  109  CO2 18* 22  --  22  GLUCOSE 191* 160*  --  90  BUN 9 5*  --  8  CREATININE 0.90 0.79 0.80 0.74  CALCIUM 9.5 9.7  --  8.7*  MG  --   --   --  2.0   GFR: Estimated Creatinine Clearance: 75.9 mL/min (by C-G formula based on SCr of 0.74 mg/dL). Liver Function Tests: Recent Labs  Lab 05/29/17 0617 05/30/17 1242  AST 35 27  ALT 15 16  ALKPHOS 126 132*  BILITOT 0.6 0.4  PROT 8.0 8.1  ALBUMIN 4.4 4.4   Recent Labs  Lab 05/29/17 0617 05/30/17 1242  LIPASE 25 29   No results for input(s): AMMONIA in the last 168 hours. Coagulation Profile: No results  for input(s): INR, PROTIME in the last 168 hours. Cardiac Enzymes: Recent Labs  Lab 05/29/17 0617 05/31/17 1111  TROPONINI <0.03 <0.03   BNP (last 3 results) No results for input(s): PROBNP in the last 8760 hours. HbA1C: No results for input(s): HGBA1C in the last 72 hours. CBG: No results for input(s): GLUCAP in the last 168 hours. Lipid Profile: No results for input(s): CHOL, HDL, LDLCALC, TRIG, CHOLHDL, LDLDIRECT in the last 72 hours. Thyroid Function Tests: No results for input(s): TSH, T4TOTAL, FREET4, T3FREE, THYROIDAB in the last 72 hours. Anemia Panel: No results for input(s): VITAMINB12, FOLATE, FERRITIN, TIBC, IRON, RETICCTPCT in the last 72 hours. Urine analysis:    Component Value Date/Time   COLORURINE YELLOW 05/30/2017 1752   APPEARANCEUR CLEAR 05/30/2017 1752   LABSPEC 1.006 05/30/2017 1752   PHURINE 6.0 05/30/2017 1752   GLUCOSEU NEGATIVE 05/30/2017 1752   HGBUR MODERATE (A) 05/30/2017 1752   BILIRUBINUR NEGATIVE 05/30/2017 1752   KETONESUR NEGATIVE 05/30/2017 1752   PROTEINUR NEGATIVE 05/30/2017 1752   UROBILINOGEN 0.2 10/10/2006 1839   NITRITE NEGATIVE 05/30/2017 1752   LEUKOCYTESUR TRACE (A) 05/30/2017 1752   Sepsis Labs: @LABRCNTIP (procalcitonin:4,lacticidven:4)  )No results found for this or any previous visit (from the past 240 hour(s)).       Radiology Studies: Dg Chest 2 View  Result Date: 05/30/2017 CLINICAL DATA:  Chest congestion and tightness, nausea, vomiting, and mid abdominal pain. No cough. Current smoker. EXAM: CHEST - 2 VIEW COMPARISON:  Portable chest x-ray of March 01, 2006 FINDINGS: The lungs are well-expanded. The interstitial markings are coarse. There is no alveolar infiltrate or pleural effusion. The heart and pulmonary vascularity are normal. The mediastinum is normal in width. The trachea is midline. There is gentle dextrocurvature centered in the lower thoracic spine. IMPRESSION: COPD-smoking related changes.  No pneumonia  nor CHF. The observed portions of the upper abdomen are normal. Electronically Signed   By: David  [place] M.D.   On: 05/30/2017 12:59   US Abdomen Limited  Result Date: 05/30/2017 CLINICAL DATA:  Epigastric pain, chest pain for 3 days EXAM: ULTRASOUND ABDOMEN LIMITED RIGHT UPPER QUADRANT COMPARISON:  CT abdomen 05/29/2017 FINDINGS: Gallbladder: No gallstones or wall thickening visualized. No sonographic Murphy sign noted by sonographer. Common bile duct: Diameter: 4.4 mm Liver: No focal lesion identified. Within normal limits in parenchymal echogenicity. Portal vein is patent on color Doppler imaging with normal direction of blood flow towards the liver. IMPRESSION: Normal right upper quadrant ultrasound. Electronically Signed   By: Elige Ko   On: 05/30/2017 17:58        Scheduled Meds: . enoxaparin (LOVENOX) injection  40 mg Subcutaneous Q24H  . feeding supplement  1 Container Oral BID BM  . morphine  15 mg Oral Q12H  . multivitamin with minerals  1 tablet Oral Daily  . nicotine  21 mg Transdermal QHS  .  pantoprazole  40 mg Oral Daily   Continuous Infusions: . 0.9 % NaCl with KCl 20 mEq / L 75 mL/hr at 05/31/17 0013     LOS: 0 days    Time spent: 25 minutes    Berton Mount, MD  [facility] Pager #: [phone] 7PM-7AM contact night coverage as above

## 2017-05-31 NOTE — Progress Notes (Signed)
Initial Nutrition Assessment  DOCUMENTATION CODES:   Not applicable  INTERVENTION:  - Will order Boost Breeze BID, each supplement provides 250 kcal and 9 grams of protein - Will order daily multivitamin with minerals.  - Continue to encourage PO intakes.    NUTRITION DIAGNOSIS:   Inadequate oral intake related to acute illness, nausea, vomiting, poor appetite as evidenced by per patient/family report.  GOAL:   Patient will meet greater than or equal to 90% of their needs  MONITOR:   PO intake, Supplement acceptance, Weight trends, Labs, I & O's  REASON FOR ASSESSMENT:   Malnutrition Screening Tool  ASSESSMENT:    52 y.o. female, with past medical history significant for chronic back pain, status post back surgery in the past presenting with 3 days history of abdominal pain, nausea, vomiting and diarrhea of watery stools, nonbloody.  Patient lives alone and denies being exposed to anybody with GI symptoms.  Reports low-grade fever.  BMI indicates overweight status. No intakes documented since admission. Diet advanced from CLD to Regular today at 10:10 AM. Pt reports sips of Sprite throughout the AM and RD visualized ~50% completion of lunch tray. Pt reports that for 3 days PTA she had severe abdominal pain and N/V and was unable to keep anything down including canned peaches, juice, or water. Last episode of emesis was last night. Pt continues to have abdominal pain, unable to elicit information concerning any ongoing nausea. She reports that she began to have a burning sensation in epigastric region after lunch and that she has never had this sensation before.  Pt reports symptoms over the past 3 days have decreased her intakes, but she has actually had a decreased appetite for unknown amount of time. She attributes this to both abdominal pain with associated nausea and stressors in her life. She is unsure of any factors that cause pain to start or that make it worse.   She  reports UBW of 172 lbs and that she weighed this "several months ago." Unable to determine a more specific time frame. She reports she lost weight from 172 lbs to 148 lbs in about a two month period and then maintained weight of 148 for "awhile."    Per chart review, [facility] and [facility]. No other weight hx available in the chart.  Medications reviewed; 20 mg IV Pepcid/day, 10 mEq IV KCl x1 run yesterday, 20 mEq oral KCl x1 dose today. Labs reviewed; Ca: 8.7 mg/dL.  IVF: NS-20 mEq KCl @ 75 mL/hr.        NUTRITION - FOCUSED PHYSICAL EXAM:  Completed/assessed upper body only and did not note any muscle or fat wasting.   Diet Order:  Diet regular Room service appropriate? Yes; Fluid consistency: Thin Suicide precautions  EDUCATION NEEDS:   No education needs have been identified at this time  Skin:  Skin Assessment: Reviewed RN Assessment  Last BM:  3/20  Height:   Ht Readings from Last 1 Encounters:  05/30/17 5\' 3"  (1.6 m)    Weight:   Wt Readings from Last 1 Encounters:  05/30/17 145 lb 8.1 oz (66 kg)    Ideal Body Weight:  52.27 kg  BMI:  Body mass index is 25.77 kg/m.  Estimated Nutritional Needs:   Kcal:  1780-1980 (27-30 kcal/kg)  Protein:  75-85 grams  Fluid:  >/= 2.2 L/day      Trenton GammonJessica Wyman Meschke, MS, RD, LDN,  CNSC Inpatient Clinical Dietitian Pager # [phone] After hours/weekend pager # [phone]

## 2017-05-31 NOTE — Clinical Social Work Note (Signed)
Clinical Social Work Assessment  Patient Details  Name: HAE AHLERS MRN: [id number] Date of Birth: 12-23-1965  Date of referral:  05/31/17               Reason for consult:  Other (Comment Required)(Patient requested to speak with Child psychotherapist)                Permission sought to share information with:    Permission granted to share information::     Name::        Agency::     Relationship::     Contact Information:     Housing/Transportation Living arrangements for the past 2 months:  No permanent address(Patient currently staying in [place] with friend) Source of Information:  Patient Patient Interpreter Needed:  None Criminal Activity/Legal Involvement Pertinent to Current Situation/Hospitalization:  No - Comment as needed Significant Relationships:  Adult Children Lives with:  Friends Do you feel safe going back to the place where you live?  Yes Need for family participation in patient care:  No (Coment)  Care giving concerns:  Patient reported that she is currently staying with a friend. Patient reported that she no longer wanted to live. Patient verbalized suicidal ideations and verbalized a plan to overdose on Ambien. Patient reported that she had Ambien at home. Psychiatrist consulted.    Social Worker assessment / plan:  CSW spoke with patient at bedside regarding her request to speak with a Child psychotherapist. Patient reported that in January she and her husband decided to split and that the divorce has been hard. Patient reported that she then moved in with her best friend and that she found her best friend dead in 15-May-2022. Patient reported that she no longer has a support system since her husband and best friend are gone. Patient reported that she briefly stayed with her daughter 2-3 weeks after she moved from her best friend's home. Patient reported that patient's daughter's husband did not want to patient to stay there and that patient had to move. Patient became  tearful when talking about her relationship with her daughter. Patient reported that she was "tired of life" and that she didn't want to deal with life anymore. CSW asked patient if she had seen a therapist in the past, patient reported that it has been over a year since she seen a therapist. Patient reported that she seen a therapist after she was diagnosed with PTSD following an incident at work in December 2017 and that she was on medication. CSW informed patient that CSW would ask patient's attending to place a psychiatry consult.   CSW asked patient's attending to place a psychiatry consult.  CSW will follow up after psychiatrist makes a recommendation.   Employment status:  Unemployed Health and safety inspector:  Self Pay (Medicaid Pending) PT Recommendations:  Not assessed at this time Information / Referral to community resources:  Other (Comment Required)(Psychiatric consult)  Patient/Family's Response to care:  Patient thanked CSW for listening.   Patient/Family's Understanding of and Emotional Response to Diagnosis, Current Treatment, and Prognosis:  Patient presented and became tearful when speaking of her current life stressors. CSW and patient discussed patient's loss and how it is impacting patient. CSW inquired about how patient was coping with the loss she has encountered recently, patient reported that she wasn't coping and that she "feel like I'm losing my mind". CSW validated patient's feelings and provided emotional support. CSW utilized the miracle question to inquire about patient's protective factors. Patient reported  that she wanted a relationship with her daughter and stability. CSW acknowledged patient's protective factors and positively affirmed patient's desire for a relationship with her daughter and her desire for stability. CSW informed patient that CSW would ask the attending MD to place a consult to psychiatry to see patient, patient verbalized understanding.    Emotional  Assessment Appearance:  Appears stated age Attitude/Demeanor/Rapport:  Other(Open) Affect (typically observed):  Tearful/Crying Orientation:  Oriented to Self, Oriented to Place, Oriented to  Time, Oriented to Situation Alcohol / Substance use:  Not Applicable Psych involvement (Current and /or in the community):  Yes (Comment)(Psych consulted)  Discharge Needs  Concerns to be addressed:  Mental Health Concerns Readmission within the last 30 days:  No Current discharge risk:  Psychiatric Illness Barriers to Discharge:  No Barriers Identified   Antionette PolesKimberly L Else Habermann, LCSW 05/31/2017, 1:25 PM

## 2017-05-31 NOTE — Care Management Note (Signed)
Case Management Note  Patient Details  Name: Brooke [name] MRN: [id number] Date of Birth: 12/28/1965  Subjective/Objective: Psych-IP Psych. Psych CSW notified.                   Action/Plan:IP Psych   Expected Discharge Date:                  Expected Discharge Plan:  Psychiatric Hospital  In-House Referral:     Discharge planning Services  CM Consult  Post Acute Care Choice:    Choice offered to:     DME Arranged:    DME Agency:     HH Arranged:    HH Agency:     Status of Service:  Completed, signed off  If discussed at [employer] Length of Stay Meetings, dates discussed:    Additional Comments:  Lanier ClamMahabir, Dorrie Cocuzza, RN 05/31/2017, 1:38 PM
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
## 2017-06-01 MED ORDER — NICOTINE 21 MG/24HR TD PT24: 21.0000 mg | MEDICATED_PATCH | Freq: Every day | TRANSDERMAL | 0 refills | Status: DC

## 2017-06-01 MED ORDER — LORATADINE 10 MG PO TABS
10.0000 mg | ORAL_TABLET | Freq: Every day | ORAL | Status: DC
  Administered 2017-06-01 – 2017-06-04 (×4): 10 mg via ORAL
  Filled 2017-06-01 (×4): qty 1

## 2017-06-01 MED ORDER — ADULT MULTIVITAMIN W/MINERALS CH: 1.0000 | ORAL_TABLET | Freq: Every day | ORAL | 0 refills | Status: DC

## 2017-06-01 MED ORDER — PANTOPRAZOLE SODIUM 40 MG PO TBEC: 40.0000 mg | DELAYED_RELEASE_TABLET | Freq: Every day | ORAL | 0 refills | Status: DC

## 2017-06-01 NOTE — Discharge Summary (Signed)
Physician Discharge Summary  Patient ID: [name] MRN: [id number] DOB/AGE: 52/03/1965 52 y.o.  Admit date: 05/30/2017 Discharge date: 06/01/2017  Admission Diagnoses:  Discharge Diagnoses:  Principal Problem:   MDD (major depressive disorder), recurrent severe, without psychosis (HCC) Active Problems:   Dehydration Brooke [name].   Discharged Condition: stable  Brief Narrative:  Patient is a 52 year old female with past medical history significant for chronic back pain, status post back surgery.  Patient presented with a 3-day history of abdominal pain, nausea, vomiting and diarrhea of watery stools, nonbloody. Patient lives alone and denies being exposed to anybody with GI symptoms. Reported low-grade fever. This is her second visit to the emergency room.  Patient was admitted for further assessment and management.  Patient was managed supportively.  No significant GI symptoms were not noted during the hospital stay.  Patient reported Brooke [name] when decision was made to discharge patient back home.  Based on patient's Brooke [name], psychiatry consult was called.  The patient was seen by the psychiatrist.  The psychiatric team is advised admitting patient to a psychiatric facility for further assessment and management of Brooke [name].  Hospital Course:  Gastroenteritis: This was managed supportively.  No diarrhea was noted during the hospital stay.  The patient did not also have nausea vomiting.  The patient was adequately hydrated.  Dehydration: This resolved with volume repletion.  Abnormal electrolytes: Patient's potassium was noted to be low on presentation.  This has been repleted.  Chronic back pain: This has remained stable.  Consults: psychiatry  Discharge Exam: Blood pressure 120/79, pulse 68, temperature 98.5 F (36.9 C), temperature source Oral, resp. rate 17, height 5\' 3"  (1.6 m), weight 66 kg (145 lb 8.1 oz), SpO2 97  %.   Disposition: Discharge disposition: [facility]-Another Health Care Institution Not Defined       Discharge Instructions    Diet - low sodium heart healthy   Complete by:  As directed    Increase activity slowly   Complete by:  As directed      Allergies as of 06/01/2017      Reactions   Iohexol     Code: HIVES, Desc: dye allergy-throat closes up, SOB, chest pain, Onset Date: [id number]      Medication List    TAKE these medications   clonazePAM 1 MG tablet Commonly known as:  KLONOPIN Take 1 mg by mouth at bedtime as needed for anxiety.   multivitamin with minerals Tabs tablet Take 1 tablet by mouth daily. Start taking on:  06/02/2017   nicotine 21 mg/24hr patch Commonly known as:  NICODERM CQ - dosed in mg/24 hours Place 1 patch (21 mg total) onto the skin at bedtime.   ondansetron 4 MG disintegrating tablet Commonly known as:  ZOFRAN ODT Take 1 tablet (4 mg total) by mouth every 8 (eight) hours as needed for up to 3 days for nausea or vomiting.   oxyCODONE-acetaminophen 10-325 MG tablet Commonly known as:  PERCOCET Take 0.5-1 tablets by mouth every 6 (six) hours as needed for pain.   pantoprazole 40 MG tablet Commonly known as:  PROTONIX Take 1 tablet (40 mg total) by mouth daily. Start taking on:  06/02/2017        Signed: Barnetta ChapelSylvester I Kiarrah Rausch 06/01/2017, 12:02 PM

## 2017-06-01 NOTE — Plan of Care (Signed)
  Problem: Elimination: Goal: Will not experience complications related to bowel motility Outcome: Progressing Note:  No diarrhea this admission. Enteric precautions d/c by MD.    Problem: Pain Managment: Goal: General experience of comfort will improve Outcome: Progressing   Problem: Safety: Goal: Ability to remain free from injury will improve Outcome: Progressing   Problem: Spiritual Needs Goal: Ability to function at adequate level Outcome: Progressing

## 2017-06-01 NOTE — BH Assessment (Signed)
Writer received phone call from Child psychotherapistocial Worker Doristine Locks(Bernette) seeking bed for inpatient treatment. Writer [facility] to [facility] Attending Physician (Dr. Jennet MaduroPucilowska) and [facility] Charge Nurse (T'Ywan). [facility] Unable to accept patient at this time due to no available beds.

## 2017-06-01 NOTE — Progress Notes (Signed)
   06/01/17 1500  Clinical Encounter Type  Visited With Patient Psychiatrist(sitter)  Visit Type Psychological support;Spiritual support;Initial  Referral From Nurse  Consult/Referral To Chaplain  Spiritual Encounters  Spiritual Needs Grief support;Emotional;Prayer  Stress Factors  Patient Stress Factors Exhausted (Hopelessness)   Responded to a consult for SI.  Patient was in the bed with a sitter.  She asked if the sitter could step out so we could talk.  Patient shared she has experienced significant loss in her life and feels hopeless.  She said that she would be going to a facility to try and help her with her SI thoughts and to deal with all that is going on in her life.  When I asked how she felt about that she feels like it is worth a try.  She feels very alone in the world.  I reassured her that while she is here she is not alone and that people are here to walk along side her.  From a spiritual standpoint I reminder her that God is with her and calls her one of his own and that He loves her, that reminder brought some comfort to her.  I let her know that we chaplains are here to support her.  Will follow and support as needed. Chaplain Agustin CreeNewton Darris Carachure

## 2017-06-01 NOTE — Progress Notes (Addendum)
LCSW following for inpatient psych placement.   Patient is voluntary.   Patient under review at [facility]. Bed tomorrow.  [facility]:  [facility] [place]  [facility]-   LCSW will continue to follow for placement.   Beulah GandyBernette Reinhart Saulters, LSCW [facility] CSW [phone]

## 2017-06-01 NOTE — Progress Notes (Signed)
Report given to [facility] Charity fundraiserN. Care ended for pt at this time.

## 2017-06-02 DIAGNOSIS — R45851 Suicidal ideations: Secondary | ICD-10-CM

## 2017-06-02 MED ORDER — CLONAZEPAM 0.5 MG PO TABS
0.5000 mg | ORAL_TABLET | Freq: Every evening | ORAL | Status: DC | PRN
  Filled 2017-06-02: qty 1

## 2017-06-02 MED ORDER — ZOLPIDEM TARTRATE 5 MG PO TABS
5.0000 mg | ORAL_TABLET | Freq: Every evening | ORAL | Status: DC | PRN
  Administered 2017-06-02: 5 mg via ORAL
  Filled 2017-06-02: qty 1

## 2017-06-02 MED ORDER — OXYCODONE HCL 5 MG PO TABS
5.0000 mg | ORAL_TABLET | Freq: Four times a day (QID) | ORAL | Status: DC | PRN
  Administered 2017-06-02 – 2017-06-04 (×5): 5 mg via ORAL
  Filled 2017-06-02 (×5): qty 1

## 2017-06-02 MED ORDER — OXYCODONE-ACETAMINOPHEN 5-325 MG PO TABS
1.0000 | ORAL_TABLET | Freq: Three times a day (TID) | ORAL | Status: DC | PRN
  Administered 2017-06-02 – 2017-06-04 (×5): 1 via ORAL
  Filled 2017-06-02 (×5): qty 1

## 2017-06-02 MED ORDER — CLONAZEPAM 1 MG PO TABS
1.0000 mg | ORAL_TABLET | Freq: Every evening | ORAL | Status: DC | PRN
  Administered 2017-06-02 – 2017-06-03 (×2): 1 mg via ORAL
  Filled 2017-06-02 (×2): qty 1

## 2017-06-02 NOTE — Progress Notes (Signed)
MD Purohit with verbal order to DC tele

## 2017-06-02 NOTE — Progress Notes (Addendum)
Clinical Social Worker following patient for discharge needs. CSW reached out to [facility] and they stated they are unable to offer a bed to patient. CSW reached [facility] to [facility] and they are looking over patients clinicals and will contact CSW back with a decision.   1:40pm CSW spoke with [facility]. Inetta Fermoina stated that patient is on to many narcotics and the medication would have to be removed prior to [facility]. Patients RN spoke with Inetta Fermoina via phone and explained to her the narcotics are for patients chronic back pain. Tina asked if patients MD would be able to speak to [facility] MD to work out a plan for patients pain. RN text paged MD to make him aware of barriers to getting patients [facility].    Marrianne MoodAshley Tywon Niday, MSW,  [employer]CSWA [phone]

## 2017-06-02 NOTE — Progress Notes (Signed)
PROGRESS NOTE    Brooke [name]  WUJ:[id number] DOB: Dec 12, 1965 DOA: 05/30/2017 PCP: Patient, No Pcp Per     Brief Narrative:   52 year old with past medical history relevant for major depression, chronic back pain status post back surgeries anxiety admitted with dehydration secondary to nausea, vomiting, diarrhea.  She was treated with IV fluids and symptom management and her symptoms resolved however just prior to discharge patient reported suicidal ideation and patient was evaluated by psychiatry who recommended inpatient treatment.   Assessment & Plan:   Principal Problem:   MDD (major depressive disorder), recurrent severe, without psychosis (HCC) Active Problems:   Dehydration   Suicidal ideation  #) Suicidal ideation: Patient apparently has lost her entire social support in the last few days.  She apparently has zolpidem at home and reports that she would use this to overdose due to feelings of hopelessness. -Pending inpatient psych placement -Psychiatry following appreciate recommendations  #) Viral gastroenteritis comp gated by dehydration: Resolved with IV fluids and supportive care with antiemetics.  #) Electrolyte abnormalities: Resolved with supplementation.  #) Psych/pain: -Continue as needed clonazepam 1 mg nightly -Continue morphine sulfate 15 mg every 12  Fluids: Tolerating p.o. Electrolytes: Monitor and supplement Nutrition: Regular diet  Prophylaxis: enox  Disposition: Pending placement in inpatient psych  Full code  Consultants:   Psychiatry  Procedures: (Don't include imaging studies which can be auto populated. Include things that cannot be auto populated i.e. Echo, Carotid and venous dopplers, Foley, Bipap, HD, tubes/drains, wound vac, central lines etc)  None  Antimicrobials: (specify start and planned stop date. Auto populated tables are space occupying and do not give end dates)  None   Subjective: Patient reports that her mood is not  changed dramatically since yesterday.  She is quite upset at so many people knowing about her psychiatric concerns.  She denies any active SI or HI at this time.  She does however have a plan in place.  Objective: Vitals:   06/01/17 0455 06/01/17 1417 06/01/17 2116 06/02/17 0502  BP: 120/79 121/82 115/64 126/73  Pulse: 68 78 60 72  Resp: 17 17 16 18   Temp: 98.5 F (36.9 C) 99 F (37.2 C) 98.7 F (37.1 C) 98.7 F (37.1 C)  TempSrc: Oral Oral Oral Oral  SpO2: 97% 98% 100% 97%  Weight:      Height:        Intake/Output Summary (Last 24 hours) at 06/02/2017 1321 Last data filed at 06/02/2017 0600 Gross per 24 hour  Intake 1500 ml  Output -  Net 1500 ml   Filed Weights   05/30/17 2056  Weight: 66 kg (145 lb 8.1 oz)    Examination:  General exam: Appears calm and comfortable  Respiratory system: Clear to auscultation. Respiratory effort normal. Cardiovascular system: Regular rate and rhythm, no murmurs Gastrointestinal system: Abdomen is nondistended, soft and nontender. No organomegaly or masses felt. Normal bowel sounds heard. Central nervous system: Alert and oriented.  Grossly intact Extremities: No edema Skin: No rashes on visible skin Psychiatry: Judgement and insight appear poor. Mood & affect depressed    Data Reviewed: I have personally reviewed following labs and imaging studies  CBC: Recent Labs  Lab 05/29/17 0621 05/30/17 1242 05/30/17 2123 05/31/17 0549  WBC 16.3* 11.7* 12.0* 9.5  NEUTROABS 13.8*  --   --   --   HGB 13.5 13.8 11.9* 11.6*  HCT 40.2 40.7 35.7* 36.2  MCV 93.7 95.1 95.7 98.6  PLT 331 291 246 251  Basic Metabolic Panel: Recent Labs  Lab 05/29/17 0617 05/30/17 1242 05/30/17 2123 05/31/17 0549  NA 136 136  --  139  K 3.6 2.9*  --  3.6  CL 103 102  --  109  CO2 18* 22  --  22  GLUCOSE 191* 160*  --  90  BUN 9 5*  --  8  CREATININE 0.90 0.79 0.80 0.74  CALCIUM 9.5 9.7  --  8.7*  MG  --   --   --  2.0   GFR: Estimated  Creatinine Clearance: 75.9 mL/min (by C-G formula based on SCr of 0.74 mg/dL). Liver Function Tests: Recent Labs  Lab 05/29/17 0617 05/30/17 1242  AST 35 27  ALT 15 16  ALKPHOS 126 132*  BILITOT 0.6 0.4  PROT 8.0 8.1  ALBUMIN 4.4 4.4   Recent Labs  Lab 05/29/17 0617 05/30/17 1242  LIPASE 25 29   No results for input(s): AMMONIA in the last 168 hours. Coagulation Profile: No results for input(s): INR, PROTIME in the last 168 hours. Cardiac Enzymes: Recent Labs  Lab 05/29/17 0617 05/31/17 1111  TROPONINI <0.03 <0.03   BNP (last 3 results) No results for input(s): PROBNP in the last 8760 hours. HbA1C: No results for input(s): HGBA1C in the last 72 hours. CBG: No results for input(s): GLUCAP in the last 168 hours. Lipid Profile: No results for input(s): CHOL, HDL, LDLCALC, TRIG, CHOLHDL, LDLDIRECT in the last 72 hours. Thyroid Function Tests: No results for input(s): TSH, T4TOTAL, FREET4, T3FREE, THYROIDAB in the last 72 hours. Anemia Panel: No results for input(s): VITAMINB12, FOLATE, FERRITIN, TIBC, IRON, RETICCTPCT in the last 72 hours. Sepsis Labs: No results for input(s): PROCALCITON, LATICACIDVEN in the last 168 hours.  No results found for this or any previous visit (from the past 240 hour(s)).       Radiology Studies: No results found.      Scheduled Meds: . enoxaparin (LOVENOX) injection  40 mg Subcutaneous Q24H  . feeding supplement  1 Container Oral BID BM  . loratadine  10 mg Oral Daily  . morphine  15 mg Oral Q12H  . multivitamin with minerals  1 tablet Oral Daily  . nicotine  21 mg Transdermal QHS  . pantoprazole  40 mg Oral Daily   Continuous Infusions: . 0.9 % NaCl with KCl 20 mEq / L 75 mL/hr at 06/02/17 0030     LOS: 0 days    Time spent: 30    Delaine LameShrey C Kaivon Livesey, MD [facility]  If 7PM-7AM, please contact night-coverage [email or web address] Password [email or web address] 06/02/2017, 1:21 PM

## 2017-06-02 NOTE — Progress Notes (Signed)
[facility] with [facility]. Requesting for Dr Clearnce SorrelPurohit to [facility]. Paged MD with number [facility].

## 2017-06-03 DIAGNOSIS — F411 Generalized anxiety disorder: Secondary | ICD-10-CM | POA: Diagnosis not present

## 2017-06-03 DIAGNOSIS — F17210 Nicotine dependence, cigarettes, uncomplicated: Secondary | ICD-10-CM | POA: Diagnosis present

## 2017-06-03 DIAGNOSIS — Z79891 Long term (current) use of opiate analgesic: Secondary | ICD-10-CM | POA: Diagnosis not present

## 2017-06-03 DIAGNOSIS — F4310 Post-traumatic stress disorder, unspecified: Secondary | ICD-10-CM | POA: Diagnosis present

## 2017-06-03 DIAGNOSIS — F332 Major depressive disorder, recurrent severe without psychotic features: Secondary | ICD-10-CM

## 2017-06-03 DIAGNOSIS — F419 Anxiety disorder, unspecified: Secondary | ICD-10-CM | POA: Diagnosis present

## 2017-06-03 DIAGNOSIS — A084 Viral intestinal infection, unspecified: Secondary | ICD-10-CM | POA: Diagnosis not present

## 2017-06-03 DIAGNOSIS — E860 Dehydration: Secondary | ICD-10-CM | POA: Diagnosis present

## 2017-06-03 DIAGNOSIS — G894 Chronic pain syndrome: Secondary | ICD-10-CM | POA: Diagnosis present

## 2017-06-03 DIAGNOSIS — R45851 Suicidal ideations: Secondary | ICD-10-CM | POA: Diagnosis present

## 2017-06-03 DIAGNOSIS — Z888 Allergy status to other drugs, medicaments and biological substances status: Secondary | ICD-10-CM | POA: Diagnosis not present

## 2017-06-03 DIAGNOSIS — Z96652 Presence of left artificial knee joint: Secondary | ICD-10-CM | POA: Diagnosis present

## 2017-06-03 DIAGNOSIS — E876 Hypokalemia: Secondary | ICD-10-CM | POA: Diagnosis present

## 2017-06-03 MED ORDER — CLONAZEPAM 1 MG PO TABS: 1.0000 mg | ORAL_TABLET | Freq: Every evening | ORAL | 0 refills | Status: DC | PRN

## 2017-06-03 MED ORDER — DM-GUAIFENESIN ER 30-600 MG PO TB12
1.0000 | ORAL_TABLET | Freq: Two times a day (BID) | ORAL | Status: DC
  Administered 2017-06-03 – 2017-06-04 (×3): 1 via ORAL
  Filled 2017-06-03 (×3): qty 1

## 2017-06-03 MED ORDER — OXYCODONE-ACETAMINOPHEN 5-325 MG PO TABS: 1.0000 | ORAL_TABLET | Freq: Three times a day (TID) | ORAL | 0 refills | Status: DC | PRN

## 2017-06-03 MED ORDER — LORATADINE 10 MG PO TABS: 10.0000 mg | ORAL_TABLET | Freq: Every day | ORAL | 1 refills | Status: DC

## 2017-06-03 NOTE — Progress Notes (Signed)
MD with verbal order for mucinex

## 2017-06-03 NOTE — Progress Notes (Signed)
Updated CSW on pt discharge ORDER TO [facility]. CSW will call back when [facility]

## 2017-06-03 NOTE — Plan of Care (Signed)
  Problem: Pain Managment: Goal: General experience of comfort will improve Outcome: Progressing   Problem: Safety: Goal: Ability to remain free from injury will improve Outcome: Progressing   Problem: Spiritual Needs Goal: Ability to function at adequate level Outcome: Progressing

## 2017-06-03 NOTE — Progress Notes (Signed)
[facility] Brooke [name] to discuss referral. Per Brooke [name], they are at capacity today, but will have discharges today. Brooke [name] says she found the note from [place]ahiriyaina yesterday that the MD contacted to discuss the patient's narcotic script, and it looks like everything should be fine.  Due to patient not having insurance, will need to admit to one of [facility] facilities. As [facility] has already declined to offer a bed to patient, [facility].  CSW alerted RN and MD. CSW will continue to follow.  Brooke NicelyElizabeth Kashia [name], [place] Clinical Social Worker [phone]

## 2017-06-03 NOTE — Progress Notes (Signed)
Pt tearful and anxious. Tried to resolve with therapeutic communication. Not effective. MD with verbal order to give bedtime dose of klonopin now. Administered to pt. Will follow up

## 2017-06-03 NOTE — Discharge Summary (Signed)
Physician Discharge Summary  [name] ZOX:[id number] DOB: 1966-01-03 DOA: 05/30/2017  PCP: Patient, No Pcp Per  Admit date: 05/30/2017 Discharge date: 06/03/2017  Admitted From: Home Disposition: [facility] inpatient psychiatry  Recommendations for Outpatient Follow-up:  1. Follow up with PCP in 1-2 weeks 2. Please obtain BMP/CBC in one week   Home Health: No Equipment/Devices: None  Discharge Condition: Stable CODE STATUS: Full Diet recommendation: Sodium restricted  Brief/Interim Summary:  #) Gastric enteritis/dehydration: Patient was admitted with dehydration and viral gastroenteritis with some electrolyte abnormalities.  She received supportive care with IV fluids and repletion of electrolyte abnormalities.  These resolved on discharge and she was tolerating a regular sodium restricted diet.  #) Suicidal ideation: Patient took Child psychotherapist side and expressed suicidal ideation with possible plan to overdose on home zolpidem.  She has significant stressors in her life including loss for support system.  Psychiatry saw the patient and recommended inpatient psychiatric evaluation which the patient is being transferred for.  #) Chronic pain syndrome: Patient has had back surgeries and is on chronic opiates for pain.  She was continued on these medications while in the hospital including morphine sulfate twice daily and as needed Percocets.  Psychiatry group at [facility] expressed concern the patient was on both chronic benzodiazepines (clonazepam 1 mg nightly as needed at home and 1 mg twice daily here) sees and requested gentle tapering.  Patient's clonazepam was decreased to 1 mg nightly and her Percocets were decreased to every 8 hours.  Patient reports that she will discuss further tapering with the inpatient psychiatrist.  Discharge Diagnoses:  Principal Problem:   MDD (major depressive disorder), recurrent severe, without psychosis (HCC) Active Problems:   Dehydration   Suicidal  ideation    Discharge Instructions  Discharge Instructions    Call MD for:  persistant nausea and vomiting   Complete by:  As directed    Call MD for:  severe uncontrolled pain   Complete by:  As directed    Call MD for:  temperature >100.4   Complete by:  As directed    Diet - low sodium heart healthy   Complete by:  As directed    Diet - low sodium heart healthy   Complete by:  As directed    Discharge instructions   Complete by:  As directed    Please follow-up with your primary care doctor in 1 week.   Increase activity slowly   Complete by:  As directed    Increase activity slowly   Complete by:  As directed      Allergies as of 06/03/2017      Reactions   Iohexol     Code: HIVES, Desc: dye allergy-throat closes up, SOB, chest pain, Onset Date: [id number]      Medication List    TAKE these medications   clonazePAM 1 MG tablet Commonly known as:  KLONOPIN Take 1 mg by mouth at bedtime as needed for anxiety. What changed:  Another medication with the same name was added. Make sure you understand how and when to take each.   clonazePAM 1 MG tablet Commonly known as:  KLONOPIN Take 1 tablet (1 mg total) by mouth at bedtime as needed (sleep). What changed:  You were already taking a medication with the same name, and this prescription was added. Make sure you understand how and when to take each.   loratadine 10 MG tablet Commonly known as:  CLARITIN Take 1 tablet (10 mg total) by  mouth daily.   multivitamin with minerals Tabs tablet Take 1 tablet by mouth daily.   nicotine 21 mg/24hr patch Commonly known as:  NICODERM CQ - dosed in mg/24 hours Place 1 patch (21 mg total) onto the skin at bedtime.   oxyCODONE-acetaminophen 10-325 MG tablet Commonly known as:  PERCOCET Take 0.5-1 tablets by mouth every 6 (six) hours as needed for pain. What changed:  Another medication with the same name was added. Make sure you understand how and when to take each.    oxyCODONE-acetaminophen 5-325 MG tablet Commonly known as:  PERCOCET/ROXICET Take 1 tablet by mouth every 8 (eight) hours as needed for moderate pain or severe pain. What changed:  You were already taking a medication with the same name, and this prescription was added. Make sure you understand how and when to take each.   pantoprazole 40 MG tablet Commonly known as:  PROTONIX Take 1 tablet (40 mg total) by mouth daily.     ASK your doctor about these medications   ondansetron 4 MG disintegrating tablet Commonly known as:  ZOFRAN ODT Take 1 tablet (4 mg total) by mouth every 8 (eight) hours as needed for up to 3 days for nausea or vomiting. Ask about: Should I take this medication?       Allergies  Allergen Reactions  . Iohexol      Code: HIVES, Desc: dye allergy-throat closes up, SOB, chest pain, Onset Date: [id number]     Consultations:  Psychiatry   Procedures/Studies: Ct Abdomen Pelvis Wo Contrast  Result Date: 05/29/2017 CLINICAL DATA:  Vomiting and diarrhea for 2 days EXAM: CT ABDOMEN AND PELVIS WITHOUT CONTRAST TECHNIQUE: Multidetector CT imaging of the abdomen and pelvis was performed following the standard protocol without IV contrast. COMPARISON:  None. FINDINGS: Lower chest: No acute abnormality. Hepatobiliary: No focal liver abnormality is seen. No gallstones, gallbladder wall thickening, or biliary dilatation. Pancreas: Unremarkable. No pancreatic ductal dilatation or surrounding inflammatory changes. Spleen: Normal in size without focal abnormality. Adrenals/Urinary Tract: Adrenal glands are unremarkable. Kidneys are normal, without renal calculi, focal lesion, or hydronephrosis. Bladder is unremarkable. Stomach/Bowel: Scattered diverticular changes noted without evidence of diverticulitis. The appendix is within normal limits. No obstructive or inflammatory changes are identified. Vascular/Lymphatic: No significant vascular findings are present. No enlarged abdominal  or pelvic lymph nodes. Reproductive: Uterus and bilateral adnexa are unremarkable. Other: A small fat containing hernia is noted in the midline just superior to the umbilicus. No bowel incarceration is identified. No abdominopelvic ascites. Musculoskeletal: Mild degenerative changes of lumbar spine are noted. IMPRESSION: Diverticulosis without evidence of diverticulitis. Small fat containing hernia anteriorly. No acute abnormality is noted within the abdomen. Electronically Signed   By: Alcide Clever M.D.   On: 05/29/2017 07:55   Dg Chest 2 View  Result Date: 05/30/2017 CLINICAL DATA:  Chest congestion and tightness, nausea, vomiting, and mid abdominal pain. No cough. Current smoker. EXAM: CHEST - 2 VIEW COMPARISON:  Portable chest x-ray of March 01, 2006 FINDINGS: The lungs are well-expanded. The interstitial markings are coarse. There is no alveolar infiltrate or pleural effusion. The heart and pulmonary vascularity are normal. The mediastinum is normal in width. The trachea is midline. There is gentle dextrocurvature centered in the lower thoracic spine. IMPRESSION: COPD-smoking related changes.  No pneumonia nor CHF. The observed portions of the upper abdomen are normal. Electronically Signed   By: David  [place] M.D.   On: 05/30/2017 12:59   US Abdomen Limited  Result Date: 05/30/2017 CLINICAL  DATA:  Epigastric pain, chest pain for 3 days EXAM: ULTRASOUND ABDOMEN LIMITED RIGHT UPPER QUADRANT COMPARISON:  CT abdomen 05/29/2017 FINDINGS: Gallbladder: No gallstones or wall thickening visualized. No sonographic Murphy sign noted by sonographer. Common bile duct: Diameter: 4.4 mm Liver: No focal lesion identified. Within normal limits in parenchymal echogenicity. Portal vein is patent on color Doppler imaging with normal direction of blood flow towards the liver. IMPRESSION: Normal right upper quadrant ultrasound. Electronically Signed   By: Elige KoHetal  Patel   On: 05/30/2017 17:58       Subjective:   Discharge Exam: Vitals:   06/02/17 2119 06/03/17 0541  BP: 110/64 104/67  Pulse: 65 63  Resp: 20 16  Temp: 98.5 F (36.9 C) 97.8 F (36.6 C)  SpO2: 96% 98%   Vitals:   06/02/17 0502 06/02/17 1509 06/02/17 2119 06/03/17 0541  BP: 126/73 108/88 110/64 104/67  Pulse: 72 72 65 63  Resp: 18 16 20 16   Temp: 98.7 F (37.1 C) 98.2 F (36.8 C) 98.5 F (36.9 C) 97.8 F (36.6 C)  TempSrc: Oral Oral Oral Oral  SpO2: 97% 99% 96% 98%  Weight:      Height:        General exam: Appears calm and comfortable  Respiratory system: Clear to auscultation. Respiratory effort normal. Cardiovascular system: Regular rate and rhythm, no murmurs Gastrointestinal system: Abdomen is nondistended, soft and nontender. No organomegaly or masses felt. Normal bowel sounds heard. Central nervous system: Alert and oriented.  Grossly intact Extremities: No edema Skin: No rashes on visible skin Psychiatry: Judgement and insight appear poor. Mood & affect depressed      The results of significant diagnostics from this hospitalization (including imaging, microbiology, ancillary and laboratory) are listed below for reference.     Microbiology: No results found for this or any previous visit (from the past 240 hour(s)).   Labs: BNP (last 3 results) No results for input(s): BNP in the last 8760 hours. Basic Metabolic Panel: Recent Labs  Lab 05/29/17 0617 05/30/17 1242 05/30/17 2123 05/31/17 0549  NA 136 136  --  139  K 3.6 2.9*  --  3.6  CL 103 102  --  109  CO2 18* 22  --  22  GLUCOSE 191* 160*  --  90  BUN 9 5*  --  8  CREATININE 0.90 0.79 0.80 0.74  CALCIUM 9.5 9.7  --  8.7*  MG  --   --   --  2.0   Liver Function Tests: Recent Labs  Lab 05/29/17 0617 05/30/17 1242  AST 35 27  ALT 15 16  ALKPHOS 126 132*  BILITOT 0.6 0.4  PROT 8.0 8.1  ALBUMIN 4.4 4.4   Recent Labs  Lab 05/29/17 0617 05/30/17 1242  LIPASE 25 29   No results for input(s): AMMONIA  in the last 168 hours. CBC: Recent Labs  Lab 05/29/17 0621 05/30/17 1242 05/30/17 2123 05/31/17 0549  WBC 16.3* 11.7* 12.0* 9.5  NEUTROABS 13.8*  --   --   --   HGB 13.5 13.8 11.9* 11.6*  HCT 40.2 40.7 35.7* 36.2  MCV 93.7 95.1 95.7 98.6  PLT 331 291 246 251   Cardiac Enzymes: Recent Labs  Lab 05/29/17 0617 05/31/17 1111  TROPONINI <0.03 <0.03   BNP: Invalid input(s): POCBNP CBG: No results for input(s): GLUCAP in the last 168 hours. D-Dimer No results for input(s): DDIMER in the last 72 hours. Hgb A1c No results for input(s): HGBA1C in the last 72  hours. Lipid Profile No results for input(s): CHOL, HDL, LDLCALC, TRIG, CHOLHDL, LDLDIRECT in the last 72 hours. Thyroid function studies No results for input(s): TSH, T4TOTAL, T3FREE, THYROIDAB in the last 72 hours.  Invalid input(s): FREET3 Anemia work up No results for input(s): VITAMINB12, FOLATE, FERRITIN, TIBC, IRON, RETICCTPCT in the last 72 hours. Urinalysis    Component Value Date/Time   COLORURINE YELLOW 05/30/2017 1752   APPEARANCEUR CLEAR 05/30/2017 1752   LABSPEC 1.006 05/30/2017 1752   PHURINE 6.0 05/30/2017 1752   GLUCOSEU NEGATIVE 05/30/2017 1752   HGBUR MODERATE (A) 05/30/2017 1752   BILIRUBINUR NEGATIVE 05/30/2017 1752   KETONESUR NEGATIVE 05/30/2017 1752   PROTEINUR NEGATIVE 05/30/2017 1752   UROBILINOGEN 0.2 10/10/2006 1839   NITRITE NEGATIVE 05/30/2017 1752   LEUKOCYTESUR TRACE (A) 05/30/2017 1752   Sepsis Labs Invalid input(s): PROCALCITONIN,  WBC,  LACTICIDVEN Microbiology No results found for this or any previous visit (from the past 240 hour(s)).   Time coordinating discharge: Over 30 minutes  SIGNED:   Delaine Lame, MD  [facility] 06/03/2017, 10:38 AM Pager   If 7PM-7AM, please contact night-coverage [email or web address] Password [email or web address]

## 2017-06-04 ENCOUNTER — Inpatient Hospital Stay (HOSPITAL_COMMUNITY)
Admission: AD | Admit: 2017-06-04 | Discharge: 2017-06-11 | DRG: 885 | Disposition: A | Discharge status: Home / Self Care | Payer: Medicaid Other | Source: Intra-hospital | Attending: Psychiatry | Admitting: Psychiatry

## 2017-06-04 ENCOUNTER — Encounter (HOSPITAL_COMMUNITY): Payer: Self-pay | Admitting: *Deleted

## 2017-06-04 ENCOUNTER — Other Ambulatory Visit: Payer: Self-pay

## 2017-06-04 DIAGNOSIS — R45851 Suicidal ideations: Secondary | ICD-10-CM | POA: Diagnosis present

## 2017-06-04 DIAGNOSIS — F419 Anxiety disorder, unspecified: Secondary | ICD-10-CM | POA: Diagnosis not present

## 2017-06-04 DIAGNOSIS — F329 Major depressive disorder, single episode, unspecified: Secondary | ICD-10-CM | POA: Diagnosis present

## 2017-06-04 DIAGNOSIS — Z96652 Presence of left artificial knee joint: Secondary | ICD-10-CM | POA: Diagnosis present

## 2017-06-04 DIAGNOSIS — F411 Generalized anxiety disorder: Secondary | ICD-10-CM | POA: Diagnosis present

## 2017-06-04 DIAGNOSIS — Z9149 Other personal history of psychological trauma, not elsewhere classified: Secondary | ICD-10-CM | POA: Diagnosis not present

## 2017-06-04 DIAGNOSIS — F332 Major depressive disorder, recurrent severe without psychotic features: Secondary | ICD-10-CM | POA: Diagnosis present

## 2017-06-04 DIAGNOSIS — G894 Chronic pain syndrome: Secondary | ICD-10-CM | POA: Diagnosis present

## 2017-06-04 HISTORY — DX: Unspecified osteoarthritis, unspecified site: M19.90

## 2017-06-04 MED ORDER — ZOLPIDEM TARTRATE 5 MG PO TABS
5.0000 mg | ORAL_TABLET | Freq: Once | ORAL | Status: AC
  Administered 2017-06-04: 5 mg via ORAL
  Filled 2017-06-04: qty 1

## 2017-06-04 MED ORDER — OXYCODONE-ACETAMINOPHEN 5-325 MG PO TABS
2.0000 | ORAL_TABLET | Freq: Three times a day (TID) | ORAL | Status: DC | PRN
  Administered 2017-06-04 – 2017-06-06 (×6): 2 via ORAL
  Filled 2017-06-04 (×6): qty 2

## 2017-06-04 MED ORDER — DM-GUAIFENESIN ER 30-600 MG PO TB12
1.0000 | ORAL_TABLET | Freq: Two times a day (BID) | ORAL | Status: DC
  Administered 2017-06-04 – 2017-06-11 (×13): 1 via ORAL
  Filled 2017-06-04 (×20): qty 1

## 2017-06-04 MED ORDER — ALUM & MAG HYDROXIDE-SIMETH 200-200-20 MG/5ML PO SUSP: 30.0000 mL | Freq: Four times a day (QID) | ORAL | Status: DC | PRN

## 2017-06-04 MED ORDER — MORPHINE SULFATE ER 15 MG PO TBCR
15.0000 mg | EXTENDED_RELEASE_TABLET | Freq: Two times a day (BID) | ORAL | Status: DC
Start: 2017-06-04 — End: 2017-06-04

## 2017-06-04 MED ORDER — ADULT MULTIVITAMIN W/MINERALS CH
1.0000 | ORAL_TABLET | Freq: Every day | ORAL | Status: DC
  Administered 2017-06-05 – 2017-06-11 (×7): 1 via ORAL
  Filled 2017-06-04 (×10): qty 1

## 2017-06-04 MED ORDER — PANTOPRAZOLE SODIUM 40 MG PO TBEC
40.0000 mg | DELAYED_RELEASE_TABLET | Freq: Every day | ORAL | Status: DC
  Administered 2017-06-05 – 2017-06-11 (×7): 40 mg via ORAL
  Filled 2017-06-04 (×12): qty 1

## 2017-06-04 MED ORDER — BOOST / RESOURCE BREEZE PO LIQD CUSTOM
1.0000 | Freq: Two times a day (BID) | ORAL | Status: DC
  Administered 2017-06-06 – 2017-06-11 (×9): 1 via ORAL
  Filled 2017-06-04 (×17): qty 1

## 2017-06-04 MED ORDER — LORATADINE 10 MG PO TABS
10.0000 mg | ORAL_TABLET | Freq: Every day | ORAL | Status: DC
  Administered 2017-06-05 – 2017-06-11 (×7): 10 mg via ORAL
  Filled 2017-06-04 (×10): qty 1

## 2017-06-04 MED ORDER — CLONAZEPAM 1 MG PO TABS
1.0000 mg | ORAL_TABLET | Freq: Every evening | ORAL | Status: DC | PRN
  Administered 2017-06-04: 1 mg via ORAL
  Filled 2017-06-04: qty 1

## 2017-06-04 MED ORDER — NICOTINE 21 MG/24HR TD PT24
21.0000 mg | MEDICATED_PATCH | Freq: Every day | TRANSDERMAL | Status: DC
  Filled 2017-06-04 (×2): qty 1

## 2017-06-04 MED ORDER — ENOXAPARIN SODIUM 40 MG/0.4ML ~~LOC~~ SOLN
40.0000 mg | SUBCUTANEOUS | Status: DC
  Filled 2017-06-04: qty 0.4

## 2017-06-04 NOTE — Progress Notes (Signed)
Adult Psychoeducational Group Note  Date:  06/04/2017 Time:  8:56 PM  Group Topic/Focus:  Wrap-Up Group:   The focus of this group is to help patients review their daily goal of treatment and discuss progress on daily workbooks.  Participation Level:  Did Not Attend  Participation Quality:  Did not attend  Affect:  Did not attend  Cognitive:  Did not attend  Insight: None  Engagement in Group:  Did not attend  Modes of Intervention:  Did not attend  Additional Comments:  Patient did not attend wrap up group tonight.  Heike Pounds L Leylani Duley 06/04/2017, 8:56 PM

## 2017-06-04 NOTE — Tx Team (Signed)
Initial Treatment Plan 06/04/2017 5:10 PM Harmoni M Molesky VOZ:[id number]    PATIENT STRESSORS: Financial difficulties Loss of mother (2nd year anniversary) Marital or family conflict   PATIENT STRENGTHS: Ability for insight Average or above average intelligence Capable of independent living Wellsite geologistCommunication skills General fund of knowledge Motivation for treatment/growth Supportive family/friends   PATIENT IDENTIFIED PROBLEMS: Suicidal ideation ""I don't feel like I can live anymore like I have been"  Depression "I can't take anymore"                   DISCHARGE CRITERIA:  Ability to meet basic life and health needs Improved stabilization in mood, thinking, and/or behavior Motivation to continue treatment in a less acute level of care Need for constant or close observation no longer present Reduction of life-threatening or endangering symptoms to within safe limits Verbal commitment to aftercare and medication compliance  PRELIMINARY DISCHARGE PLAN: Outpatient therapy Return to previous living arrangement  PATIENT/FAMILY INVOLVEMENT: This treatment plan has been presented to and reviewed with the patient, Unknown Jimmyee M Rockman, and/or family member.  The patient and family have been given the opportunity to ask questions and make suggestions.  Hoover BrownsJones, [place] Talcott Howard, RN 06/04/2017, 5:10 PM

## 2017-06-04 NOTE — Progress Notes (Addendum)
D: Pt was at the nurse's station upon initial approach.  Pt presents with labile affect and mood.  She was irritable, anxious upon initial assessment related to her pain medications.  She discussed how she has had back surgery and she has chronic pain.   She talked about the medication she was taking prior to admission.  States "I got rods and stuff in my back.  I haven't had my medicine since 0800 this morning."  Endorses passive SI with plan but would not disclose plan.  She states "I'm not going to do anything to myself here" when asked if [facility].  Denies HI.  Denies current AVH but reports "occasionally I see like shadows and hear my mom."  Denies AVH today.  Pt had multiple requests of staff at beginning of shift.  She asked if she could listen to music in her room, mentioned she wished she was allowed to have her cell phone with her, discussed how she may have difficulty sleeping without a television in the room.  She reports chronic back pain of 8/10.  Pt has been visible in milieu intermittently with few peer interactions.  Did not attend evening group.  Pt is using wheelchair for mobility at times.  A: Introduced self to pt.  Actively listened to pt and offered support and encouragement.  Explained current medication regimen in detail to pt and pt verbalized understanding.  PRN medication administered for pain and sleep.  Fall prevention techniques reviewed with pt and she verbalized understanding.  PO fluids encouraged and provided.  Q15 minute safety checks maintained.    R: Pt is safe on the unit.  Pt is compliant with medications and mouth check was performed after medication administrations.  Her mood improved as the evening progressed.  Pt verbally contracts for safety.  Will continue to monitor and assess.

## 2017-06-04 NOTE — Significant Event (Signed)
Patient was not transfer to psychiatric hospital as there was no availability.  She was briefly seen today and did not have any issues or complaints.  Her vital signs have been reassuring.  Her medications were unchanged.  She is stable for discharge to psychiatric hospital.  No charge

## 2017-06-04 NOTE — Plan of Care (Signed)
  Problem: Self-Concept: Goal: Ability to disclose and discuss suicidal ideas will improve Outcome: Progressing Note:  Pt endorses passive SI tonight.  She reports having a plan but did not disclose plan to Clinical research associatewriter.  Pt verbally contracts for safety.

## 2017-06-04 NOTE — Progress Notes (Signed)
Patient ID: [name], female   DOB: 10/05/1965, 52 y.o.   MRN: [id number] [facility] d/t verbalization of thoughts of suicide.  Pt stated she initially went to the hospital with c/o stomach/chest pains and difficulty breathing.  While there she stated she spoke with a Child psychotherapistsocial worker and told her that she had a bottle of Ambien at home and she didn't feel like living anymore.  Pt stressors include 2nd anniversary of loss of mother, recent separation from husband and financial concerns.  After separating from spouse she stated she moved in with a friend and after approximately 3 weeks she she found her friend deceased in her room.  Pt verbalized she sees shadows and hear her deceased mother talking at times.  Pt denied current suicidal ideation.  Pt tearful during admission process.  Pt does state that she has a supportive daughter but doesn't not want to burden her with everything.  Fifteen minute checks initiated for patient safety.  Pt safe on unit.

## 2017-06-04 NOTE — [facility] Group Notes (Signed)
[facility] Group Notes:  (Nursing/MHT/Case Management/Adjunct)  Date:  06/04/2017  Time:  1600  Type of Therapy:  Nurse Education  - Positive Self Talk to Promote Wellness  Participation Level:  Did Not Attend  Participation Quality:    Affect:    Cognitive:    Insight:    Engagement in Group:    Modes of Intervention:    Summary of Progress/Problems: Patient was invited to attend however elected to remain in bed.  Merian CapronFriedman, Voshon Petro [facility] 06/04/2017, 4:53 PM

## 2017-06-04 NOTE — Progress Notes (Signed)
LCSW following for inpatient psych placement.   [facility]  Patient can arrive at 1:30.  Attending: Sharma CovertNorman   Accepting: [place]  [facility] will [facility].   Facility would like for attending to document that patient understands her Klonopin will be tapered in dc summary.   RN report number: [phone]- [id number]

## 2017-06-04 NOTE — Progress Notes (Signed)
Report given to Roddie McElizabeth Awofadeju, [facility]. [facility].

## 2017-06-04 NOTE — Progress Notes (Signed)
MS Contin 15 mg found in floor of pt room. Writer ask pt about pill and pt stated"I don't know how it got there." Pill wasted in sink witnessed by Ernst BreachMarissa Long, Charity fundraiserN. MD notified.

## 2017-06-04 NOTE — Progress Notes (Addendum)
Pt called out requesting pain medication. Writer took pt scheduled medication and PRN pain medication to pt room. Writer observed pt taking the pain pills out of pill cup and hid them in her hand and then took the rest of her medications. When writer ask pt what she hand in her hand pt stated" nothing." writer ask pt if it was her pain pills and pt stated " oh yeah, I can't take them all together." Writer ask pt to take pain mediation and writer watched and checked pt mouth to make sure pt swallowed the medication.

## 2017-06-04 NOTE — Progress Notes (Signed)
LCSW set up transport at 1:22pm with [facility] for 2:15 with 2:30 arrival.  Coralyn HellingBernette Jasean Ambrosia, [facility] Hospital[facility]W [place] Long CSW [phone]

## 2017-06-05 DIAGNOSIS — F419 Anxiety disorder, unspecified: Secondary | ICD-10-CM

## 2017-06-05 DIAGNOSIS — Z9149 Other personal history of psychological trauma, not elsewhere classified: Secondary | ICD-10-CM

## 2017-06-05 DIAGNOSIS — F411 Generalized anxiety disorder: Secondary | ICD-10-CM

## 2017-06-05 DIAGNOSIS — G894 Chronic pain syndrome: Secondary | ICD-10-CM

## 2017-06-05 DIAGNOSIS — F332 Major depressive disorder, recurrent severe without psychotic features: Principal | ICD-10-CM

## 2017-06-05 MED ORDER — MORPHINE SULFATE ER 15 MG PO TBCR
15.0000 mg | EXTENDED_RELEASE_TABLET | Freq: Two times a day (BID) | ORAL | Status: DC
  Administered 2017-06-05 – 2017-06-11 (×12): 15 mg via ORAL
  Filled 2017-06-05 (×12): qty 1

## 2017-06-05 MED ORDER — CLONAZEPAM 0.5 MG PO TABS
0.5000 mg | ORAL_TABLET | Freq: Every evening | ORAL | Status: DC | PRN
  Administered 2017-06-05 – 2017-06-10 (×6): 0.5 mg via ORAL
  Filled 2017-06-05 (×7): qty 1

## 2017-06-05 MED ORDER — TRAZODONE HCL 50 MG PO TABS
50.0000 mg | ORAL_TABLET | Freq: Every evening | ORAL | Status: DC | PRN
  Administered 2017-06-05: 50 mg via ORAL
  Filled 2017-06-05 (×7): qty 1

## 2017-06-05 MED ORDER — HYDROXYZINE HCL 25 MG PO TABS
25.0000 mg | ORAL_TABLET | ORAL | Status: DC | PRN
  Administered 2017-06-05 – 2017-06-08 (×5): 25 mg via ORAL
  Filled 2017-06-05 (×6): qty 1
  Filled 2017-06-05: qty 10

## 2017-06-05 MED ORDER — NICOTINE 21 MG/24HR TD PT24
21.0000 mg | MEDICATED_PATCH | Freq: Every day | TRANSDERMAL | Status: DC
  Administered 2017-06-05 – 2017-06-11 (×7): 21 mg via TRANSDERMAL
  Filled 2017-06-05 (×10): qty 1

## 2017-06-05 MED ORDER — DULOXETINE HCL 30 MG PO CPEP
30.0000 mg | ORAL_CAPSULE | Freq: Once | ORAL | Status: AC
  Administered 2017-06-05: 30 mg via ORAL
  Filled 2017-06-05: qty 1

## 2017-06-05 MED ORDER — BUSPIRONE HCL 10 MG PO TABS
10.0000 mg | ORAL_TABLET | Freq: Two times a day (BID) | ORAL | Status: DC
  Administered 2017-06-05 – 2017-06-07 (×4): 10 mg via ORAL
  Filled 2017-06-05 (×8): qty 1

## 2017-06-05 MED ORDER — DULOXETINE HCL 60 MG PO CPEP
60.0000 mg | ORAL_CAPSULE | Freq: Every day | ORAL | Status: DC
  Administered 2017-06-06 – 2017-06-07 (×2): 60 mg via ORAL
  Filled 2017-06-05 (×4): qty 1

## 2017-06-05 NOTE — Progress Notes (Signed)
Wrtier met with pt this afternoon. Pt cautious during assessment and didn't want to disclose to writer what contributed to her increase in depression and suicidal thoughts. Pt stated that she wanted to speak to the doctor about what's going on with her. Pt denies any active SI. Pt asked about taking pain meds for chronic pain. Writer made pt aware of prn schedule for pain meds. Pt stated that she wanted to stay in her room for lunch. Pt wheelchair at bedside and within arm's reach. Pt appears to be withdrawn and isolative to her room. Verbal support provided. Pt encouraged to attend groups as scheduled. 15 minute checks performed for safety.

## 2017-06-05 NOTE — Plan of Care (Signed)
  Problem: Safety: Goal: Periods of time without injury will increase Outcome: Progressing Note:  Pt safe on the unit at this time    

## 2017-06-05 NOTE — [facility] Group Notes (Signed)
  [facility] LCSW Group Therapy Note  Date/Time: 06/05/16, 1315  Type of Therapy/Topic:  Group Therapy:  Emotion Regulation  Participation Level:  Minimal   Mood: very quiety  Description of Group:    The purpose of this group is to assist patients in learning to regulate negative emotions and experience positive emotions. Patients will be guided to discuss ways in which they have been vulnerable to their negative emotions. These vulnerabilities will be juxtaposed with experiences of positive emotions or situations, and patients challenged to use positive emotions to combat negative ones. Special emphasis will be placed on coping with negative emotions in conflict situations, and patients will process healthy conflict resolution skills.  Therapeutic Goals: 1. Patient will identify two positive emotions or experiences to reflect on in order to balance out negative emotions:  2. Patient will label two or more emotions that they find the most difficult to experience:  3. Patient will be able to demonstrate positive conflict resolution skills through discussion or role plays:   Summary of Patient Progress:Pt did not participate in group.  She did answer CSW questions and reported that grief is the emotion that is difficult for her to experience.  No other input during group discussion.       Therapeutic Modalities:   Cognitive Behavioral Therapy Feelings Identification Dialectical Behavioral Therapy  Daleen SquibbGreg Marv Alfrey, LCSW

## 2017-06-05 NOTE — Progress Notes (Signed)
NUTRITION ASSESSMENT  Pt identified as at risk on the Malnutrition Screen Tool  INTERVENTION: 1. Educated patient on the importance of nutrition and encouraged intake of food and beverages. 2. Discussed weight goals. 3. Supplements: continue Boost Breeze BID, each supplement provides 250 kcal and 9 grams of protein.   NUTRITION DIAGNOSIS: Unintentional weight loss related to sub-optimal intake as evidenced by pt report.   Goal: Pt to meet >/= 90% of their estimated nutrition needs.  Monitor:  PO intake  Assessment:  Pt admitted for SI. She has been anxious. She has PMH of chronic back pain. No recent weight hx available in the chart. [facility] At that time, pt reported UBW of 172 lbs and that she had lost weight several months PTA and had weighed current weight (145-ish lbs) for "awhile."  Boost Breeze was ordered BID [facility]. Continue supplement at this time. Continue to encourage PO intakes of meals, supplements, and snacks.    52 y.o. female  Height: Ht Readings from Last 1 Encounters:  06/04/17 5' 1.5" (1.562 m)    Weight: Wt Readings from Last 1 Encounters:  06/04/17 145 lb (65.8 kg)    Weight Hx: Wt Readings from Last 10 Encounters:  06/04/17 145 lb (65.8 kg)  05/30/17 145 lb 8.1 oz (66 kg)    BMI:  Body mass index is 26.95 kg/m. Pt meets criteria for overweight based on current BMI.  Estimated Nutritional Needs: Kcal: 25-30 kcal/kg Protein: > 1 gram protein/kg Fluid: 1 ml/kcal  Diet Order: Diet regular Room service appropriate? Yes; Fluid consistency: Thin Pt is also offered choice of unit snacks mid-morning and mid-afternoon.  Pt is eating as desired.   Lab results and medications reviewed.      Trenton GammonJessica Lavell Ridings, MS, RD, LDN, [facility] Inpatient Clinical Dietitian Pager # [phone] After hours/weekend pager # [phone]

## 2017-06-05 NOTE — Plan of Care (Signed)
  Problem: Activity: Goal: Imbalance in normal sleep/wake cycle will improve Outcome: Progressing Note:  Slept 6 hours last night according to flowsheet.
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
## 2017-06-05 NOTE — Progress Notes (Signed)
D: Pt passive SI/ AV[facility]- contracts for safety, pt very depressed feels she has nothing to live for. Pt continues to express constant pain with no hope of it getting better. Pt is hopeful of possibly getting surgery to help ease  Her pain , but her Medicaid has gotten messed up per patient  . Pt main focus  On medications and pain A: Pt was offered support and encouragement. Pt was given scheduled medications. Pt was encourage to attend groups. Q 15 minute checks were done for safety.   R:Pt attends groups and interacts well with peers and staff. Pt is taking medication. Pt receptive to treatment and safety maintained on unit.

## 2017-06-05 NOTE — Progress Notes (Signed)
Adult Psychoeducational Group Note  Date:  06/05/2017 Time:  1500 Group Topic/Focus:  Coping With Mental Health Crisis:   The purpose of this group is to help patients identify strategies for coping with mental health crisis.  Group discusses possible causes of crisis and ways to manage them effectively.  Participation Level:  Did not attend.    

## 2017-06-05 NOTE — [facility] Group Notes (Signed)
Pt was invited but did not attend orientation/goals group. 

## 2017-06-06 LAB — CREATININE, SERUM: Creatinine, Ser: 0.78 mg/dL (ref 0.44–1.00)

## 2017-06-06 MED ORDER — TRAZODONE HCL 100 MG PO TABS
100.0000 mg | ORAL_TABLET | Freq: Every evening | ORAL | Status: DC | PRN
  Administered 2017-06-06 – 2017-06-10 (×7): 100 mg via ORAL
  Filled 2017-06-06 (×20): qty 1

## 2017-06-06 MED ORDER — OXYCODONE-ACETAMINOPHEN 5-325 MG PO TABS
2.0000 | ORAL_TABLET | Freq: Four times a day (QID) | ORAL | Status: DC | PRN
  Administered 2017-06-06 – 2017-06-11 (×15): 2 via ORAL
  Filled 2017-06-06 (×16): qty 2

## 2017-06-06 NOTE — Evaluation (Addendum)
Physical Therapy Evaluation Patient Details Name: [name] MRN: [id number] DOB: 11/14/1965 Today's Date: 06/06/2017   History of Present Illness  52 yo female admitted  06/04/17  with history depression, SI.  H/O L TKA and back surgery  Clinical Impression  The patient participated in evaluation, Adjusted RW. Patient reports that she does not have Equipment since relocating to this area. PT will assess need for cane vs. RW next visit.  Instructed to continue  left knee exercises.Pt admitted with above diagnosis. Pt currently with functional limitations due to the deficits listed below (see PT Problem List).  Pt will benefit from skilled PT to increase their independence and safety with mobility to allow discharge to the venue listed below.   The patient demonstrates safe ambulation with RW currently on the unit.        Follow Up Recommendations No PT follow up    Equipment Recommendations  Rolling walker with 5" wheels;Cane(will assess which is needed most)    Recommendations for Other Services       Precautions / Restrictions Precautions Precautions: Fall      Mobility  Bed Mobility Overal bed mobility: Independent                Transfers Overall transfer level: Modified independent                  Ambulation/Gait Ambulation/Gait assistance: Modified independent (Device/Increase time) Ambulation Distance (Feet): 15 Feet Assistive device: Rolling walker (2 wheeled) Gait Pattern/deviations: Step-through pattern;Step-to pattern;Antalgic     General Gait Details: sequence is smoothe, gait seed is slow  Information systems managertairs            Wheelchair Mobility    Modified Rankin (Stroke Patients Only)       Balance                                             Pertinent Vitals/Pain Pain Assessment: 0-10 Pain Score: 7  Pain Location: left knee and back Pain Descriptors / Indicators: Aching;Discomfort;Grimacing;Guarding Pain  Intervention(s): Patient requesting pain meds-RN notified    Home Living Family/patient expects to be discharged to:: Unsure                 Additional Comments: housing is unstable at this time. Per patient, recent separation from spouse, friend passed away with whom she was living.    Prior Function Level of Independence: Independent;Independent with assistive device(s)         Comments: used RW or SPC or no AD depending on pain, reports All AD devices are no longer available since move to this area     Hand Dominance        Extremity/Trunk Assessment   Upper Extremity Assessment Upper Extremity Assessment: Overall WFL for tasks assessed    Lower Extremity Assessment Lower Extremity Assessment: LLE deficits/detail LLE Deficits / Details: 1090 knee flexion, reports that she cane flex more when medication is effective    Cervical / Trunk Assessment Cervical / Trunk Assessment: Normal  Communication   Communication: No difficulties  Cognition Arousal/Alertness: Awake/alert Behavior During Therapy: WFL for tasks assessed/performed Overall Cognitive Status: Within Functional Limits for tasks assessed  General Comments      Exercises Total Joint Exercises Quad Sets: AROM;Left;Seated;5 reps Heel Slides: AROM;Left;Seated;5 reps Long Arc Quad: AROM;Seated;Left;5 reps   Assessment/Plan    PT Assessment Patient needs continued PT services  PT Problem List Decreased strength;Decreased range of motion;Decreased mobility;Pain       PT Treatment Interventions Gait training;Functional mobility training;Therapeutic exercise    PT Goals (Current goals can be found in the Care Plan section)  Acute Rehab PT Goals Patient Stated Goal: to get my life straightened out PT Goal Formulation: With patient Time For Goal Achievement: 06/13/17 Potential to Achieve Goals: Good    Frequency Min 1X/week   Barriers to  discharge        Co-evaluation               AM-PAC PT "6 Clicks" Daily Activity  Outcome Measure Difficulty turning over in bed (including adjusting bedclothes, sheets and blankets)?: None Difficulty moving from lying on back to sitting on the side of the bed? : None Difficulty sitting down on and standing up from a chair with arms (e.g., wheelchair, bedside commode, etc,.)?: None Help needed moving to and from a bed to chair (including a wheelchair)?: A Little Help needed walking in hospital room?: A Little Help needed climbing 3-5 steps with a railing? : Total 6 Click Score: 19    End of Session   Activity Tolerance: Patient tolerated treatment well Patient left: in bed;with call bell/phone within reach Nurse Communication: Mobility status;Patient requests pain meds PT Visit Diagnosis: Unsteadiness on feet (R26.81);Pain Pain - Right/Left: Left Pain - part of body: Knee    Time: 1530-1600 PT Time Calculation (min) (ACUTE ONLY): 30 min   Charges:   PT Evaluation $PT Eval Low Complexity: 1 Low PT Treatments $Gait Training: 8-22 mins   PT G CodesBlanchard [name] PT [phone]   Brooke [name] 06/06/2017, 4:15 PM

## 2017-06-06 NOTE — Tx Team (Signed)
Interdisciplinary Treatment and Diagnostic Plan Update  06/06/2017 Time of Session: 9:30am CHARMELLE [facility] MRN: [id number]  Principal Diagnosis: Major depression, recurrent, severe without psychotic features, generalized anxiety disorder   Secondary Diagnoses: Active Problems:   MDD (major depressive disorder)   Current Medications:  Current Facility-Administered Medications  Medication Dose Route Frequency Provider Last Rate Last Dose  . alum & mag hydroxide-simeth (MAALOX/MYLANTA) 200-200-20 MG/5ML suspension 30 mL  30 mL Oral Q6H PRN Fransisca Kaufmann A, NP      . busPIRone (BUSPAR) tablet 10 mg  10 mg Oral BID Antonieta Pert, MD   10 mg at 06/06/17 0742  . clonazePAM (KLONOPIN) tablet 0.5 mg  0.5 mg Oral QHS PRN Antonieta Pert, MD   0.5 mg at 06/05/17 2138  . dextromethorphan-guaiFENesin (MUCINEX DM) 30-600 MG per 12 hr tablet 1 tablet  1 tablet Oral BID Thermon Leyland, NP   1 tablet at 06/06/17 0744  . DULoxetine (CYMBALTA) DR capsule 60 mg  60 mg Oral Daily Antonieta Pert, MD   60 mg at 06/06/17 4098  . feeding supplement (BOOST / RESOURCE BREEZE) liquid 1 Container  1 Container Oral BID BM Thermon Leyland, NP   1 Container at 06/06/17 1134  . hydrOXYzine (ATARAX/VISTARIL) tablet 25 mg  25 mg Oral Q4H PRN Antonieta Pert, MD   25 mg at 06/06/17 1353  . loratadine (CLARITIN) tablet 10 mg  10 mg Oral Daily Fransisca Kaufmann A, NP   10 mg at 06/06/17 0744  . morphine (MS CONTIN) 12 hr tablet 15 mg  15 mg Oral Q12H Antonieta Pert, MD   15 mg at 06/06/17 1137  . multivitamin with minerals tablet 1 tablet  1 tablet Oral Daily Fransisca Kaufmann A, NP   1 tablet at 06/06/17 0744  . nicotine (NICODERM CQ - dosed in mg/24 hours) patch 21 mg  21 mg Transdermal Daily Armandina Stammer I, NP   21 mg at 06/06/17 0745  . oxyCODONE-acetaminophen (PERCOCET/ROXICET) 5-325 MG per tablet 2 tablet  2 tablet Oral Q8H PRN Cobos, Rockey Situ, MD   2 tablet at 06/06/17 [phone]  . pantoprazole (PROTONIX) EC tablet 40  mg  40 mg Oral Daily Fransisca Kaufmann A, NP   40 mg at 06/06/17 0744  . traZODone (DESYREL) tablet 50 mg  50 mg Oral QHS,MR X 1 Kerry Hough, PA-C   50 mg at 06/05/17 2138   PTA Medications: Medications Prior to Admission  Medication Sig Dispense Refill Last Dose  . clonazePAM (KLONOPIN) 1 MG tablet Take 1 tablet (1 mg total) by mouth at bedtime as needed (sleep). 30 tablet 0   . oxyCODONE-acetaminophen (PERCOCET) 10-325 MG tablet Take 0.5-1 tablets by mouth every 6 (six) hours as needed for pain.   05/29/2017 at Unknown time  . oxyCODONE-acetaminophen (PERCOCET/ROXICET) 5-325 MG tablet Take 1 tablet by mouth every 8 (eight) hours as needed for moderate pain or severe pain. 30 tablet 0     Patient Stressors: Financial difficulties Loss of mother (2nd year anniversary) Marital or family conflict  Patient Strengths: Ability for insight Average or above average intelligence Capable of independent living Wellsite geologist fund of knowledge Motivation for treatment/growth Supportive family/friends  Treatment Modalities: Medication Management, Group therapy, Case management,  1 to 1 session with clinician, Psychoeducation, Recreational therapy.   Physician Treatment Plan for Primary Diagnosis: Major depression, recurrent, severe without psychotic features, generalized anxiety disorder  Long Term Goal(s): Improvement in symptoms so as ready  for discharge Improvement in symptoms so as ready for discharge   Short Term Goals: Ability to identify changes in lifestyle to reduce recurrence of condition will improve Ability to verbalize feelings will improve Ability to disclose and discuss suicidal ideas Ability to demonstrate self-control will improve Ability to identify and develop effective coping behaviors will improve Ability to maintain clinical measurements within normal limits will improve Ability to identify changes in lifestyle to reduce recurrence of condition will  improve Ability to verbalize feelings will improve Ability to disclose and discuss suicidal ideas Ability to demonstrate self-control will improve Ability to identify and develop effective coping behaviors will improve Ability to maintain clinical measurements within normal limits will improve  Medication Management: Evaluate patient's response, side effects, and tolerance of medication regimen.  Therapeutic Interventions: 1 to 1 sessions, Unit Group sessions and Medication administration.  Evaluation of Outcomes: Progressing  Physician Treatment Plan for Secondary Diagnosis: Active Problems:   MDD (major depressive disorder)  Long Term Goal(s): Improvement in symptoms so as ready for discharge Improvement in symptoms so as ready for discharge   Short Term Goals: Ability to identify changes in lifestyle to reduce recurrence of condition will improve Ability to verbalize feelings will improve Ability to disclose and discuss suicidal ideas Ability to demonstrate self-control will improve Ability to identify and develop effective coping behaviors will improve Ability to maintain clinical measurements within normal limits will improve Ability to identify changes in lifestyle to reduce recurrence of condition will improve Ability to verbalize feelings will improve Ability to disclose and discuss suicidal ideas Ability to demonstrate self-control will improve Ability to identify and develop effective coping behaviors will improve Ability to maintain clinical measurements within normal limits will improve     Medication Management: Evaluate patient's response, side effects, and tolerance of medication regimen.  Therapeutic Interventions: 1 to 1 sessions, Unit Group sessions and Medication administration.  Evaluation of Outcomes: Progressing   RN Treatment Plan for Primary Diagnosis: Major depression, recurrent, severe without psychotic features, generalized anxiety disorder  Long  Term Goal(s): Knowledge of disease and therapeutic regimen to maintain health will improve  Short Term Goals: Ability to remain free from injury will improve, Ability to verbalize feelings will improve, Ability to disclose and discuss suicidal ideas and Compliance with prescribed medications will improve  Medication Management: RN will administer medications as ordered by provider, will assess and evaluate patient's response and provide education to patient for prescribed medication. RN will report any adverse and/or side effects to prescribing provider.  Therapeutic Interventions: 1 on 1 counseling sessions, Psychoeducation, Medication administration, Evaluate responses to treatment, Monitor vital signs and CBGs as ordered, Perform/monitor CIWA, COWS, AIMS and Fall Risk screenings as ordered, Perform wound care treatments as ordered.  Evaluation of Outcomes: Progressing   LCSW Treatment Plan for Primary Diagnosis: Major depression, recurrent, severe without psychotic features, generalized anxiety disorder  Long Term Goal(s): Safe transition to appropriate next level of care at discharge, Engage patient in therapeutic group addressing interpersonal concerns.  Short Term Goals: Engage patient in aftercare planning with referrals and resources, Increase social support, Increase emotional regulation and Increase skills for wellness and recovery  Therapeutic Interventions: Assess for all discharge needs, 1 to 1 time with Social worker, Explore available resources and support systems, Assess for adequacy in community support network, Educate family and significant other(s) on suicide prevention, Complete Psychosocial Assessment, Interpersonal group therapy.  Evaluation of Outcomes: Progressing   Progress in Treatment: Attending groups: Yes. Participating in groups: Yes. Taking  medication as prescribed: Yes. Toleration medication: Yes. Family/Significant other contact made: No, will contact:   patient declined. Patient understands diagnosis: Yes. Discussing patient identified problems/goals with staff: Yes. Medical problems stabilized or resolved: Yes. Patient reports pain and mobility issues, physical therapy consult ordered. Denies suicidal/homicidal ideation: No. Endorses passive SI Issues/concerns per patient self-inventory: Yes. Other: No discharge plan at this time.  New problem(s) identified: No, Describe:  patient does not have living arrangements at this time  New Short Term/Long Term Goal(s): Patient's goal "I don't want to keep feeling complete hopelessness."   Discharge Plan or Barriers: CSW continuing to assess  Reason for Continuation of Hospitalization: Anxiety Depression Suicidal ideation  Estimated Length of Stay: Continuing to assess  Attendees: Patient: Florencia Reasonsmyee Brunelle 06/06/2017 3:06 PM  Physician:  06/06/2017 3:06 PM  Nursing: Rodell PernaPatrice, RN 06/06/2017 3:06 PM  RN Care Manager: 06/06/2017 3:06 PM  Social Worker: Garner NashGregory Wierda, CSW 06/06/2017 3:06 PM  Recreational Therapist:  06/06/2017 3:06 PM  Other: Enid Cutterharlotte Tamara Monteith, Social Work Intern 06/06/2017 3:06 PM  Other:  06/06/2017 3:06 PM  Other: 06/06/2017 3:06 PM    Scribe for Treatment Team: Darreld Mcleanharlotte C Diyari Cherne, Student-Social Work 06/06/2017 3:06 PM
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
## 2017-06-06 NOTE — Progress Notes (Signed)
Adult Psychoeducational Group Note  Date:  06/06/2017 Time:  9:44 PM  Group Topic/Focus:  Wrap-Up Group:   The focus of this group is to help patients review their daily goal of treatment and discuss progress on daily workbooks.  Participation Level:  Active  Participation Quality:  Appropriate  Affect:  Appropriate  Cognitive:  Alert and Oriented  Insight: Good  Engagement in Group:  Engaged  Modes of Intervention:  Discussion  Additional Comments:  Pt attended group this evening. Pt rated her day a 5/10.  Leo GrosserMegan A Andi Layfield 06/06/2017, 9:44 PM
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
## 2017-06-06 NOTE — Progress Notes (Addendum)
Patient ID: [name], female   DOB: 09/16/1965, 52 y.o.   MRN: [id number]  D: Patient in wheelchair at start of shift, and this RN was instructed by Physician to give patient a walker, and patient tolerated ambulating with walker, and was later seen by the Physical therapist who also ambulated in the hallways with patient.  Pt endorsess +SI, denies having a plan, and verbally contracts for safety on the unit.  Pt also reported +AVH; stated that she hears her mother's voice talking to her and that she sees shadows. Pt reports that her sleep quality last night as poor, appetite as fair, energy level as low, and concentration level as poor.  Pt rates her depression as 10 (10 being the highest level),her hopelessness level as 10 (10 being the worst), her anxiety level as 10 (10 being the highest level).  Pt complained of excruciating back pain of 10/10, and was focused on her pain medications through entire shift.  Pt was medicated with Percocet 2tablets at 1609 for back pain and Atarax 25mg  at 1353 for anxiety.  A: Patient is being given all meds as ordered, empathy being provided as needed.  Pt also educated on all of her medications, and verbalized understanding.  Q15 minute safety checks in place.  R: Patient denies having any current concerns, will continue to monitor.

## 2017-06-06 NOTE — Progress Notes (Signed)
[facility] MD Progress Note  06/06/2017 6:08 PM Brooke [name]  MRN:  [id number] Subjective: Patient is seen and examined.  Patient is a 52 year old female with a history of major depression and recent suicidal ideation.  She seen in follow-up.  She was seen in treatment team today by nursing, doctors as well as social workers.  She stated she continues to have a difficult time.  She is having problems with pain.  She very much is worried about her psychosocial situation.  She is concerned about the possibility of being homeless.  She is focused on her Klonopin, and we did discuss the fact that I could not find an active prescription for her, but she seemed to suggest she was taking it 2-3 times a day.  That is waivered at times.  Today she told me after treatment team that she was taking it during the daytime and not at night.  She also stated that she takes narcotics during the night when she is unable to sleep.  She wanted me to make sure that she got her narcotics 4 times a day.  She stated she continued to have fleeting suicidal ideation.  She just stated that she was unable to be "homeless".  We discussed at length some of her psychosocial problems that she is having.  She denied any side effects to her current medications.  She did state that she has seen commercials on television about using other medications to augment antidepressant medications.   Principal Problem: <principal problem not specified> Diagnosis:   Patient Active Problem List   Diagnosis Date Noted  . MDD (major depressive disorder) [F32.9] 06/04/2017  . Suicidal ideation [R45.851] 06/02/2017  . MDD (major depressive disorder), recurrent severe, without psychosis ([place]) [F33.2]   . Dehydration [E86.0] 05/30/2017   Total Time spent with patient: 30 minutes  Past Psychiatric History: See admission H&P  Past Medical History:  Past Medical History:  Diagnosis Date  . Osteoarthritis     Past Surgical History:  Procedure Laterality  Date  . BACK SURGERY    . knee replacement Left   . TOTAL KNEE ARTHROPLASTY     Family History: History reviewed. No pertinent family history. Family Psychiatric  History: See admission H&P Social History:  Social History   Substance and Sexual Activity  Alcohol Use Not Currently     Social History   Substance and Sexual Activity  Drug Use Not on file    Social History   Socioeconomic History  . Marital status: Single    Spouse name: Not on file  . Number of children: Not on file  . Years of education: Not on file  . Highest education level: Not on file  Occupational History  . Not on file  Social Needs  . Financial resource strain: Not on file  . Food insecurity:    Worry: Not on file    Inability: Not on file  . Transportation needs:    Medical: Not on file    Non-medical: Not on file  Tobacco Use  . Smoking status: Current Every Day Smoker    Packs/day: 2.00    Years: 29.00    Pack years: 58.00    Types: Cigarettes  . Smokeless tobacco: Never Used  Substance and Sexual Activity  . Alcohol use: Not Currently  . Drug use: Not on file  . Sexual activity: Not on file  Lifestyle  . Physical activity:    Days per week: Not on file  Minutes per session: Not on file  . Stress: Not on file  Relationships  . Social connections:    Talks on phone: Not on file    Gets together: Not on file    Attends religious service: Not on file    Active member of club or organization: Not on file    Attends meetings of clubs or organizations: Not on file    Relationship status: Not on file  Other Topics Concern  . Not on file  Social History Narrative  . Not on file   Additional Social History:                         Sleep: Fair  Appetite:  Fair  Current Medications: Current Facility-Administered Medications  Medication Dose Route Frequency Provider Last Rate Last Dose  . alum & mag hydroxide-simeth (MAALOX/MYLANTA) 200-200-20 MG/5ML suspension 30 mL   30 mL Oral Q6H PRN Elmarie Shiley A, NP      . busPIRone (BUSPAR) tablet 10 mg  10 mg Oral BID Sharma Covert, MD   10 mg at 06/06/17 1610  . clonazePAM (KLONOPIN) tablet 0.5 mg  0.5 mg Oral QHS PRN Sharma Covert, MD   0.5 mg at 06/05/17 2138  . dextromethorphan-guaiFENesin (MUCINEX DM) 30-600 MG per 12 hr tablet 1 tablet  1 tablet Oral BID Elmarie Shiley A, NP   1 tablet at 06/06/17 1610  . DULoxetine (CYMBALTA) DR capsule 60 mg  60 mg Oral Daily Sharma Covert, MD   60 mg at 06/06/17 3846  . feeding supplement (BOOST / RESOURCE BREEZE) liquid 1 Container  1 Container Oral BID BM Niel Hummer, NP   1 Container at 06/06/17 1134  . hydrOXYzine (ATARAX/VISTARIL) tablet 25 mg  25 mg Oral Q4H PRN Sharma Covert, MD   25 mg at 06/06/17 1353  . loratadine (CLARITIN) tablet 10 mg  10 mg Oral Daily Elmarie Shiley A, NP   10 mg at 06/06/17 0744  . morphine (MS CONTIN) 12 hr tablet 15 mg  15 mg Oral Q12H Sharma Covert, MD   15 mg at 06/06/17 1137  . multivitamin with minerals tablet 1 tablet  1 tablet Oral Daily Elmarie Shiley A, NP   1 tablet at 06/06/17 0744  . nicotine (NICODERM CQ - dosed in mg/24 hours) patch 21 mg  21 mg Transdermal Daily Lindell Spar I, NP   21 mg at 06/06/17 0745  . oxyCODONE-acetaminophen (PERCOCET/ROXICET) 5-325 MG per tablet 2 tablet  2 tablet Oral Q8H PRN Cobos, Myer Peer, MD   2 tablet at 06/06/17 1609  . pantoprazole (PROTONIX) EC tablet 40 mg  40 mg Oral Daily Elmarie Shiley A, NP   40 mg at 06/06/17 0744  . traZODone (DESYREL) tablet 50 mg  50 mg Oral QHS,MR X 1 Laverle Hobby, PA-C   50 mg at 06/05/17 2138    Lab Results:  Results for orders placed or performed during the hospital encounter of 06/04/17 (from the past 48 hour(s))  Creatinine, serum     Status: None   Collection Time: 06/06/17  6:28 AM  Result Value Ref Range   Creatinine, Ser 0.78 0.44 - 1.00 mg/dL   GFR calc non Af Amer >60 >60 mL/min   GFR calc Af Amer >60 >60 mL/min    Comment:  (NOTE) The eGFR has been calculated using the CKD EPI equation. This calculation has not been validated in all clinical  situations. eGFR's persistently <60 mL/min signify possible Chronic Kidney Disease. Performed at [facility], [place] [address], [place],  [address]     Blood Alcohol level:  Lab Results  Component Value Date   [facility]  10/10/2006    <5        LOWEST DETECTABLE LIMIT FOR SERUM ALCOHOL IS 11 mg/dL FOR MEDICAL PURPOSES ONLY    Metabolic Disorder Labs: No results found for: HGBA1C, MPG No results found for: PROLACTIN No results found for: CHOL, TRIG, HDL, CHOLHDL, VLDL, LDLCALC  Physical Findings: AIMS:  , ,  ,  ,    CIWA:    COWS:     Musculoskeletal: Strength & Muscle Tone: decreased Gait & Station: unsteady Patient leans: N/A  Psychiatric Specialty Exam: Physical Exam  Nursing note and vitals reviewed. Constitutional: She is oriented to person, place, and time. She appears well-developed and well-nourished.  Musculoskeletal: Normal range of motion.  Neurological: She is alert and oriented to person, place, and time.    ROS  Blood pressure 98/75, pulse 89, temperature 97.6 F (36.4 C), temperature source Oral, resp. rate 18, height 5' 1.5" (1.562 m), weight 65.8 kg (145 lb).Body mass index is 26.95 kg/m.  General Appearance: Disheveled  Eye Contact:  Fair  Speech:  Clear and Coherent  Volume:  Decreased  Mood:  Depressed  Affect:  Congruent  Thought Process:  Coherent  Orientation:  Full (Time, Place, and Person)  Thought Content:  Negative and Hallucinations: Visual  Suicidal Thoughts:  Yes.  without intent/plan  Homicidal Thoughts:  No  Memory:  Immediate;   Fair  Judgement:  Impaired  Insight:  Lacking  Psychomotor Activity:  Negative  Concentration:  Concentration: Fair  Recall:  [place] of Knowledge:  Good  Language:  Good  Akathisia:  No  Handed:  Right  AIMS (if indicated):     Assets:  Communication  Skills Desire for Improvement  ADL's:  Intact  Cognition:  WNL  Sleep:  Number of Hours: 6.5     Treatment Plan Summary: Daily contact with patient to assess and evaluate symptoms and progress in treatment, Medication management and Plan Patient is seen and examined.  Patient is a 52 year old female with the above-stated past psychiatric history seen in follow-up.  She is essentially unchanged from yesterday.  She did admit that much of her stress right now has to do with her fear of living.  We discussed the possibility of going to an assisted living facility.  She stated she does not have the resources for that.  She stated that her father has resources, but when she had previously contacted him trying to get housing he refused.  It sounds as though she is burned many bridges, and she has very little social support at this time.  She is focused on her pain medicines, and she lays in bed a great deal.  I told her that she needed to get up and move around and work with social work because the longer she laid the worse her pain would get.  She has limited insight to her current situation.  I tried to encourage her to become more motivated, and to take better role in her own self-care.  I think she is probably talking about Abilify augmentation, and I just do not think it is justified at this point.  She did suggest that she was having some kind of visual hallucinations with shadows, and if she continues to complain of that  then I will consider adding an antipsychotic agent.  No other changes in her medicines at this point.  I will confirm her Vicodin dosage to see if she needs anything at night.  I am concerned about some dependency issues with regard to her narcotics.  Sharma Covert, MD 06/06/2017, 6:08 PM

## 2017-06-06 NOTE — Progress Notes (Signed)
Recreation Therapy Notes  Date: 3.27.19 Time: 9:30 a.m. Location: [address] Dayroom   Group Topic: Stress Management   Goal Area(s) Addresses:  Goal 1.1: To reduce stress  -Patient will report feeling a reduction in stress level  -Patient will identify the importance of stress management  -Patient will participate during stress management group treatment     Intervention: Stress Management   Activity: Guided Imagery- Patients were in a peaceful environment with soft lighting enhancing patients mood. Patients were read a guided imagery script to help decrease stress levels   Education: Stress Management, Discharge Planning.    Education Outcome: Acknowledges edcuation/In group clarification offered/Needs additional education   Clinical Observations/Feedback:: Patient did not attend     Sheryle HailDarian Earlie Arciga, Recreation Therapy Intern   Sheryle HailDarian Koltan Portocarrero 06/06/2017 8:32 AM

## 2017-06-06 NOTE — Progress Notes (Signed)
D: Pt passive SI-contracts for safety. Pt is pleasant and cooperative. Pt stated her  Mood was a little better.   A: Pt was offered support and encouragement. Pt was given scheduled medications. Pt was encourage to attend groups. Q 15 minute checks were done for safety.   R:Pt attends groups and interacts well with peers and staff. Pt is taking medication. Pt receptive to treatment and safety maintained on unit.

## 2017-06-07 DIAGNOSIS — F411 Generalized anxiety disorder: Secondary | ICD-10-CM

## 2017-06-07 DIAGNOSIS — G894 Chronic pain syndrome: Secondary | ICD-10-CM

## 2017-06-07 MED ORDER — DULOXETINE HCL 30 MG PO CPEP
90.0000 mg | ORAL_CAPSULE | Freq: Every day | ORAL | Status: DC
  Administered 2017-06-08 – 2017-06-11 (×4): 90 mg via ORAL
  Filled 2017-06-07 (×5): qty 3
  Filled 2017-06-07: qty 21

## 2017-06-07 MED ORDER — BUSPIRONE HCL 10 MG PO TABS
10.0000 mg | ORAL_TABLET | Freq: Three times a day (TID) | ORAL | Status: DC
  Administered 2017-06-07 – 2017-06-11 (×12): 10 mg via ORAL
  Filled 2017-06-07 (×2): qty 21
  Filled 2017-06-07 (×2): qty 1
  Filled 2017-06-07: qty 21
  Filled 2017-06-07 (×12): qty 1

## 2017-06-07 NOTE — [facility] Group Notes (Signed)
[facility] Group Notes:  (Nursing/MHT/Case Management/Adjunct)  Date:  06/07/2017  Time:  4:00 pm  Type of Therapy:  Psychoeducational Skills  Participation Level:  Active  Participation Quality:  Appropriate  Affect:  Appropriate  Cognitive:  Appropriate  Insight:  Appropriate  Engagement in Group:  Engaged  Modes of Intervention:  Education  Summary of Progress/Problems:  Patient active, alert and participated in group.    Earline MayotteKnight, Raynette Arras Shephard 06/07/2017, 5:56 PM

## 2017-06-07 NOTE — Progress Notes (Signed)
D Patient is observed ambulating down the hall using a walker. She is weary. She has a haggard-like, tired appearance. She says " I hurt so bad...sometimes it gets the best of me".  A She completed her daily assessment as requested this am and on this she wrote she denied having SI today and she rated her depression, hopelessness and anxiety " 9/9/9", respectively. She requested 2 percocet " for this pain" at 0800, when she took her scheduled MS Contin 15 mg. Writer asked her if she would be willing to wait 25-35 minutes, long enough for MS Contin to begin to work and she said " I need the percocet now. I've been all night long without it and  my back is killing me". Prn percocet (2) was administered  as ordered by patient's MD. Writer observed patient ambulating in the hallway, using  Her  walker. She is cautious, slow and steady and says " I'm ok to use the walker.Marland Kitchen.ibuprofen'm just so glad to be out of that wheelchair"  R  Safety is in place and poc established. Staff to cont to work with pt, assisting pt to learn new healthier coping skills and learning new coping behaviors.

## 2017-06-07 NOTE — Plan of Care (Signed)
  Problem: Safety: Goal: Ability to identify and utilize support systems that promote safety will improve Outcome: Progressing Note:  Pt safe on the unit at this this time    Problem: Self-Concept: Goal: Ability to disclose and discuss suicidal ideas will improve Outcome: Progressing Note:  Pt passive SI-contract for safety

## 2017-06-07 NOTE — Progress Notes (Signed)
[facility] MD Progress Note  06/07/2017 2:46 PM Brooke [name]  MRN:  [id number] Subjective: Patient is seen and examined.  Patient is a 51-year-old female with a past psychiatric history significant for major depression who is seen in follow-up.  She has been up ambulating today with her walker, and has taken a shower.  She still rates her depression as a 9 out of 10, and that she continues to have intermittent suicidal ideation.  Is felt that the majority of this is because of psychosocial circumstances.  A lot of it has to do with the fact she is essentially homeless.  We spent the majority today talking about empowering her, her being more proactive in her care, and her being more proactive and housing in life issues.  We discussed increasing her Cymbalta as well as her BuSpar. Principal Problem: <principal problem not specified> Diagnosis:   Patient Active Problem List   Diagnosis Date Noted  . MDD (major depressive disorder) [F32.9] 06/04/2017  . Suicidal ideation [R45.851] 06/02/2017  . MDD (major depressive disorder), recurrent severe, without psychosis ([facility]) [F33.2]   . Dehydration [E86.0] 05/30/2017   Total Time spent with patient: 20 minutes  Past Psychiatric History: See admission H&P  Past Medical History:  Past Medical History:  Diagnosis Date  . Osteoarthritis     Past Surgical History:  Procedure Laterality Date  . BACK SURGERY    . knee replacement Left   . TOTAL KNEE ARTHROPLASTY     Family History: History reviewed. No pertinent family history. Family Psychiatric  History: See admission H&P Social History:  Social History   Substance and Sexual Activity  Alcohol Use Not Currently     Social History   Substance and Sexual Activity  Drug Use Not on file    Social History   Socioeconomic History  . Marital status: Single    Spouse name: Not on file  . Number of children: Not on file  . Years of education: Not on file  . Highest education level: Not on file   Occupational History  . Not on file  Social Needs  . Financial resource strain: Not on file  . Food insecurity:    Worry: Not on file    Inability: Not on file  . Transportation needs:    Medical: Not on file    Non-medical: Not on file  Tobacco Use  . Smoking status: Current Every Day Smoker    Packs/day: 2.00    Years: 29.00    Pack years: 58.00    Types: Cigarettes  . Smokeless tobacco: Never Used  Substance and Sexual Activity  . Alcohol use: Not Currently  . Drug use: Not on file  . Sexual activity: Not on file  Lifestyle  . Physical activity:    Days per week: Not on file    Minutes per session: Not on file  . Stress: Not on file  Relationships  . Social connections:    Talks on phone: Not on file    Gets together: Not on file    Attends religious service: Not on file    Active member of club or organization: Not on file    Attends meetings of clubs or organizations: Not on file    Relationship status: Not on file  Other Topics Concern  . Not on file  Social History Narrative  . Not on file   Additional Social History:                           Sleep: Fair  Appetite:  Fair  Current Medications: Current Facility-Administered Medications  Medication Dose Route Frequency Provider Last Rate Last Dose  . alum & mag hydroxide-simeth (MAALOX/MYLANTA) 200-200-20 MG/5ML suspension 30 mL  30 mL Oral Q6H PRN Elmarie Shiley A, NP      . busPIRone (BUSPAR) tablet 10 mg  10 mg Oral TID Sharma Covert, MD      . clonazePAM Bobbye [place]) tablet 0.5 mg  0.5 mg Oral QHS PRN Sharma Covert, MD   0.5 mg at 06/06/17 2227  . dextromethorphan-guaiFENesin (MUCINEX DM) 30-600 MG per 12 hr tablet 1 tablet  1 tablet Oral BID Niel Hummer, NP   1 tablet at 06/07/17 0759  . [START ON 06/08/2017] DULoxetine (CYMBALTA) DR capsule 90 mg  90 mg Oral Daily Sharma Covert, MD      . feeding supplement (BOOST / RESOURCE BREEZE) liquid 1 Container  1 Container Oral BID BM  Niel Hummer, NP   1 Container at 06/07/17 1417  . hydrOXYzine (ATARAX/VISTARIL) tablet 25 mg  25 mg Oral Q4H PRN Sharma Covert, MD   25 mg at 06/06/17 2227  . loratadine (CLARITIN) tablet 10 mg  10 mg Oral Daily Elmarie Shiley A, NP   10 mg at 06/07/17 0759  . morphine (MS CONTIN) 12 hr tablet 15 mg  15 mg Oral Q12H Sharma Covert, MD   15 mg at 06/07/17 0801  . multivitamin with minerals tablet 1 tablet  1 tablet Oral Daily Niel Hummer, NP   1 tablet at 06/07/17 0758  . nicotine (NICODERM CQ - dosed in mg/24 hours) patch 21 mg  21 mg Transdermal Daily Lindell Spar I, NP   21 mg at 06/07/17 0801  . oxyCODONE-acetaminophen (PERCOCET/ROXICET) 5-325 MG per tablet 2 tablet  2 tablet Oral Q6H PRN Sharma Covert, MD   2 tablet at 06/07/17 1415  . pantoprazole (PROTONIX) EC tablet 40 mg  40 mg Oral Daily Elmarie Shiley A, NP   40 mg at 06/07/17 0801  . traZODone (DESYREL) tablet 100 mg  100 mg Oral QHS,MR X 1 Laverle Hobby, PA-C   100 mg at 06/06/17 2228    Lab Results:  Results for orders placed or performed during the hospital encounter of 06/04/17 (from the past 48 hour(s))  Creatinine, serum     Status: None   Collection Time: 06/06/17  6:28 AM  Result Value Ref Range   Creatinine, Ser 0.78 0.44 - 1.00 mg/dL   GFR calc non Af Amer >60 >60 mL/min   GFR calc Af Amer >60 >60 mL/min    Comment: (NOTE) The eGFR has been calculated using the CKD EPI equation. This calculation has not been validated in all clinical situations. eGFR's persistently <60 mL/min signify possible Chronic Kidney Disease. Performed at [facility], [place] [address], [place], [place] [address]     Blood Alcohol level:  Lab Results  Component Value Date   [facility]  10/10/2006    <5        LOWEST DETECTABLE LIMIT FOR SERUM ALCOHOL IS 11 mg/dL FOR MEDICAL PURPOSES ONLY    Metabolic Disorder Labs: No results found for: HGBA1C, MPG No results found for: PROLACTIN No results found for:  CHOL, TRIG, HDL, CHOLHDL, VLDL, LDLCALC  Physical Findings: AIMS:  , ,  ,  ,    CIWA:    COWS:     Musculoskeletal: Strength & Muscle Tone: abnormal Gait & Station: shuffle  Patient leans: N/A  Psychiatric Specialty Exam: Physical Exam  Nursing note and vitals reviewed.   Review of Systems  Musculoskeletal: Positive for back pain.  Neurological: Positive for weakness.  Psychiatric/Behavioral: Positive for depression and suicidal ideas.    Blood pressure (!) 98/50, pulse 80, temperature 98.8 F (37.1 C), temperature source Oral, resp. rate 16, height 5' 1.5" (1.562 m), weight 65.8 kg (145 lb).Body mass index is 26.95 kg/m.  General Appearance: Disheveled  Eye Contact:  Fair  Speech:  Slow  Volume:  Decreased  Mood:  Depressed  Affect:  Congruent  Thought Process:  Coherent  Orientation:  Full (Time, Place, and Person)  Thought Content:  Negative  Suicidal Thoughts:  Yes.  without intent/plan  Homicidal Thoughts:  No  Memory:  Immediate;   Fair  Judgement:  Impaired  Insight:  Lacking  Psychomotor Activity:  Decreased  Concentration:  Concentration: Fair  Recall:  Fair  Fund of Knowledge:  Fair  Language:  Fair  Akathisia:  No  Handed:  Right  AIMS (if indicated):     Assets:  Desire for Improvement  ADL's:  Intact  Cognition:  WNL  Sleep:  Number of Hours: 5.5     Treatment Plan Summary: Daily contact with patient to assess and evaluate symptoms and progress in treatment, Medication management and Plan Patient is seen and examined.  Patient is a 51-year-old female with the above-stated past psychiatric history who is seen in follow-up.  She continues to express depressive symptoms including helplessness, hopelessness and worthlessness.  We will increase her Cymbalta to 90 mg p.o. daily.  Additionally she is complained of continued anxiety.  I will increase her BuSpar to 10 mg p.o. 3 times daily.  The majority today was spent trying to empower her to be more  proactive in her care, and self determinate and living circumstances.  Hopefully between social work, psychosocial interventions, and medications we will see some improvement.  She clearly is a little bit better from a physical standpoint.  She spent the last couple of days in a wheelchair and in bed, and at least today she is up walking around with a walker.  She also took a shower and improved her hygiene.  Hopefully this will continue to improve.   [place] , MD 06/07/2017, 2:46 PM 

## 2017-06-07 NOTE — Progress Notes (Signed)
CSW spoke to pt about housing options.  She has a friend in [place] who has a studio apartment that she knows she can go to.  She is interested in the assisted living option that she and Dr Jola Babinskilary discussed but she currently has family planning medicaid only.  We discussed need for regular medicaid for assisted living.  Pt has already spoken to her medicaid worker: Ms. Romeo Apple[place]arrison of [place] [employer] and pt hopes that she may already have the regular medicaid back in place.  CSW will check medicaid status and attempt to call DSS worker. Garner NashGregory Auther Lyerly, MSW, LCSW Clinical Social Worker 06/07/2017 9:52 AM

## 2017-06-07 NOTE — [facility] Group Notes (Signed)
[facility] LCSW Group Therapy Note  Date/Time  06/07/17   1:15pm  Type of Therapy/Topic:  Group Therapy:  Balance in Life  Participation Level:  Minimal- appropriate  Description of Group:    This group will address the concept of balance and how it feels and looks when one is unbalanced. Patients will be encouraged to process areas in their lives that are out of balance, and identify reasons for remaining unbalanced. Facilitators will guide patients utilizing problem- solving interventions to address and correct the stressor making their life unbalanced. Understanding and applying boundaries will be explored and addressed for obtaining  and maintaining a balanced life. Patients will be encouraged to explore ways to assertively make their unbalanced needs known to significant others in their lives, using other group members and facilitator for support and feedback.  Therapeutic Goals: 1. Patient will identify two or more emotions or situations they have that consume much of in their lives. 2. Patient will identify signs/triggers that life has become out of balance:  3. Patient will identify two ways to set boundaries in order to achieve balance in their lives:  4. Patient will demonstrate ability to communicate their needs through discussion and/or role plays  Summary of Patient Progress: Patient attended group and minimally participated. Patient identified family and personal relationships as areas of her life that feel most out of balance at this time.   Therapeutic Modalities:   Cognitive Behavioral Therapy Solution-Focused Therapy Assertiveness Training

## 2017-06-07 NOTE — [facility] Group Notes (Signed)
Adult Psychoeducational Group Note  Date:  06/07/2017 Time:  11:41 PM  Group Topic/Focus:  Wrap-Up Group:   The focus of this group is to help patients review their daily goal of treatment and discuss progress on daily workbooks.  Participation Level:  Active  Participation Quality:  Appropriate and Attentive  Affect:  Appropriate  Cognitive:  Alert and Appropriate  Insight: Appropriate and Good  Engagement in Group:  Engaged  Modes of Intervention:  Discussion and Education  Additional Comments:  Pt attended and participated in wrap up group this evening. Pt had a better day than yesterday mentally, but they were up and down emotionally. Pt goal was to talk to the psychiatrist today.   Chrisandra NettersOctavia A Monasia Lair 06/07/2017, 11:41 PM

## 2017-06-07 NOTE — Progress Notes (Signed)
D: Pt passive SI/ AVH- contracts for safety. Pt stated her  Mood was alittle better. Pt was visible in dayroom  Watching TV with peers. Pt stated she felt better using the walker.  A: Pt was offered support and encouragement. Pt was given scheduled medications. Pt was encourage to attend groups. Q 15 minute checks were done for safety.   R:Pt attends groups and interacts well with peers and staff. Pt is taking medication. Pt receptive to treatment and safety maintained on unit.

## 2017-06-08 MED ORDER — QUETIAPINE FUMARATE 25 MG PO TABS
25.0000 mg | ORAL_TABLET | Freq: Every day | ORAL | Status: DC
Start: 2017-06-08 — End: 2017-06-09
  Administered 2017-06-08: 25 mg via ORAL
  Filled 2017-06-08 (×4): qty 1

## 2017-06-08 NOTE — Progress Notes (Signed)
Recreation Therapy Notes  Date: 3.29.19 Time: 9:30 a.m. Location: [address] Dayroom   Group Topic: Stress Management   Goal Area(s) Addresses:  Goal 1.1: To reduce stress  -Patient will report feeling a reduction in stress level  -Patient will identify the importance of stress management  -Patient will participate during stress management group treatment     Intervention: Stress Management   Activity: Meditation- Patients were in a peaceful environment with soft lighting enhancing patients mood. Patients listened to a mindfulness meditation voiceover to help reduce stress levels    Education: Stress Management, Discharge Planning.    Education Outcome: Acknowledges edcuation/In group clarification offered/Needs additional education   Clinical Observations/Feedback:: Patient did not attend     Sheryle HailDarian Makinlee Awwad, Recreation Therapy Intern   Sheryle HailDarian Vicie Cech 06/08/2017 8:28 AM

## 2017-06-08 NOTE — [facility] Group Notes (Signed)
LCSW Group Therapy Note 06/08/2017 2:23 PM  Type of Therapy and Topic: Group Therapy: Avoiding Self-Sabotaging and Enabling Behaviors  Participation Level: Did Not Attend  Description of Group:  In this group, patients will learn how to identify obstacles, self-sabotaging and enabling behaviors, as well as: what are they, why do we do them and what needs these behaviors meet. Discuss unhealthy relationships and how to have positive healthy boundaries with those that sabotage and enable. Explore aspects of self-sabotage and enabling in yourself and how to limit these self-destructive behaviors in everyday life.  Therapeutic Goals: 1. Patient will identify one obstacle that relates to self-sabotage and enabling behaviors 2. Patient will identify one personal self-sabotaging or enabling behavior they did prior to admission 3. Patient will state a plan to change the above identified behavior 4. Patient will demonstrate ability to communicate their needs through discussion and/or role play.   Summary of Patient Progress:  Invited, chose not to attend.    Therapeutic Modalities:  Cognitive Behavioral Therapy Person-Centered Therapy Motivational Interviewing   Baldo DaubJolan Vy Badley LCSWA Clinical Social Worker

## 2017-06-08 NOTE — Plan of Care (Signed)
She takes her medications without difficulty.  She will ask questions if needed and understands her medications

## 2017-06-08 NOTE — Progress Notes (Signed)
When asked about her day pt stated, "it'sbeen up and down".Stated, "Over yesterday, it's better". Stated "my thinking is a lot better". Pt and Clinical research associatewriter reminisced about pt's and writer's hometown in [place]. Pt also informed the writer that PT is supposed to be coming to assess her for a cane. Pt has no other questions or concerns.    A:  Support and encouragement was offered. 15 min checks continued for safety.  R: Pt remains safe.

## 2017-06-08 NOTE — Progress Notes (Signed)
PT Cancellation Note  Patient Details Name: Brooke [name] MRN: [id number] DOB: 11/07/1965   Cancelled Treatment:    Reason Eval/Treat Not Completed: Patient at procedure or test/unavailable, patient is currently in a meeting(not a group). Left exercise program  In room, RN aware. Hard copy in soft chart.Will check back another time.   Rada HayHill, Alisi Lupien Elizabeth 06/08/2017, 2:55 PM  Blanchard KelchKaren Natanya Holecek PT [phone]

## 2017-06-08 NOTE — Progress Notes (Signed)
D:  Brooke [name] has been in her room much of the day.  She continues to report feeling depressed and anxious.  She she required her pain medications before lunch.  RN checked for cheeking and patient was upset that RN had to do that.  She would not lift up her tongue and she did take her dentures out.  No evidence of cheeking noted. Explained to her that we do that with certain medications because we want the unit to be safe.  She didn't attend am group and chose to lay in the bed.  She denies SI/HI and stated that she sees shadows at times but none currently.  She will contract for safety on the unit.  She is up in the day room more this afternoon.  She did go to the cafeteria to eat lunch.  She is walking steady with her walker.  She did complete her self inventory and reported that her depression was 7/10, hopelessness was 5/10 and her anxiety is 8/10.  She reported her goal for today was "plan for future" and she will accomplish this goal by "meet with treatment team, phone calls."  A:  1:1 for support and encouragement.  Medications as ordered.  Q 15 minute checks maintained for safety.  Encouraged participation in group and unit activities. R:  Brooke [name] remains safe on the unit.  We will continue to monitor the progress towards her goals.

## 2017-06-08 NOTE — Progress Notes (Signed)
[facility] MD Progress Note  06/08/2017 2:46 PM Brooke [name]  MRN:  [id number] Subjective: Patient is seen and examined.  Patient is a 52 year old female with a past psychiatric history significant for major depression and generalized anxiety disorder.  She is seen in follow-up.  Her friend came to visit last night and unfortunately she is now coaxed him into contacting her father.  She again is handed off control and responsibility to others.  As one might suspect she is now back in bed, sitting in the dark.  She has minimal insight into this.  I tried to get her to recognize this, but to no avail.  She continues to focus on her pain, and stated she was not sleeping well.  She had previously intubated that she wanted to get something to augment the Cymbalta for depression.  She had discussed with nursing "fleeting shadows" that she had been seeing.  I have added Seroquel low-dose to titrate to help her sleep and also with possible psychotic symptoms.  Since her friend has decided to let her move in with him, she this a.m. stated that she is no longer suicidal at this point.  That she really has very little motivation at this point, and remains isolated a great deal.  I had thought we had made some progress yesterday. Principal Problem: <principal problem not specified> Diagnosis:   Patient Active Problem List   Diagnosis Date Noted  . Generalized anxiety disorder [F41.1]   . Chronic pain syndrome [G89.4]   . MDD (major depressive disorder) [F32.9] 06/04/2017  . Suicidal ideation [R45.851] 06/02/2017  . MDD (major depressive disorder), recurrent severe, without psychosis (HCC) [F33.2]   . Dehydration [E86.0] 05/30/2017   Total Time spent with patient: 20 minutes  Past Psychiatric History: See admission H&P  Past Medical History:  Past Medical History:  Diagnosis Date  . Osteoarthritis     Past Surgical History:  Procedure Laterality Date  . BACK SURGERY    . knee replacement Left   . TOTAL KNEE  ARTHROPLASTY     Family History: History reviewed. No pertinent family history. Family Psychiatric  History: See admission H&P Social History:  Social History   Substance and Sexual Activity  Alcohol Use Not Currently     Social History   Substance and Sexual Activity  Drug Use Not on file    Social History   Socioeconomic History  . Marital status: Single    Spouse name: Not on file  . Number of children: Not on file  . Years of education: Not on file  . Highest education level: Not on file  Occupational History  . Not on file  Social Needs  . Financial resource strain: Not on file  . Food insecurity:    Worry: Not on file    Inability: Not on file  . Transportation needs:    Medical: Not on file    Non-medical: Not on file  Tobacco Use  . Smoking status: Current Every Day Smoker    Packs/day: 2.00    Years: 29.00    Pack years: 58.00    Types: Cigarettes  . Smokeless tobacco: Never Used  Substance and Sexual Activity  . Alcohol use: Not Currently  . Drug use: Not on file  . Sexual activity: Not on file  Lifestyle  . Physical activity:    Days per week: Not on file    Minutes per session: Not on file  . Stress: Not on file  Relationships  .  Social connections:    Talks on phone: Not on file    Gets together: Not on file    Attends religious service: Not on file    Active member of club or organization: Not on file    Attends meetings of clubs or organizations: Not on file    Relationship status: Not on file  Other Topics Concern  . Not on file  Social History Narrative  . Not on file   Additional Social History:                         Sleep: Poor  Appetite:  Fair  Current Medications: Current Facility-Administered Medications  Medication Dose Route Frequency Provider Last Rate Last Dose  . alum & mag hydroxide-simeth (MAALOX/MYLANTA) 200-200-20 MG/5ML suspension 30 mL  30 mL Oral Q6H PRN Fransisca Kaufmannavis, Laura A, NP      . busPIRone (BUSPAR)  tablet 10 mg  10 mg Oral TID Antonieta Pertlary, Yadriel Kerrigan Lawson, MD   10 mg at 06/08/17 1219  . clonazePAM (KLONOPIN) tablet 0.5 mg  0.5 mg Oral QHS PRN Antonieta Pertlary, Keiran Sias Lawson, MD   0.5 mg at 06/07/17 2239  . dextromethorphan-guaiFENesin (MUCINEX DM) 30-600 MG per 12 hr tablet 1 tablet  1 tablet Oral BID Thermon Leylandavis, Laura A, NP   1 tablet at 06/08/17 0829  . DULoxetine (CYMBALTA) DR capsule 90 mg  90 mg Oral Daily Antonieta Pertlary, Amer Alcindor Lawson, MD   90 mg at 06/08/17 [id number]  . feeding supplement (BOOST / RESOURCE BREEZE) liquid 1 Container  1 Container Oral BID BM Thermon Leylandavis, Laura A, NP   1 Container at 06/08/17 [phone]  . hydrOXYzine (ATARAX/VISTARIL) tablet 25 mg  25 mg Oral Q4H PRN Antonieta Pertlary, Isaias Dowson Lawson, MD   25 mg at 06/07/17 2239  . loratadine (CLARITIN) tablet 10 mg  10 mg Oral Daily Fransisca Kaufmannavis, Laura A, NP   10 mg at 06/08/17 [id number]  . morphine (MS CONTIN) 12 hr tablet 15 mg  15 mg Oral Q12H Antonieta Pertlary, Derreck Wiltsey Lawson, MD   15 mg at 06/08/17 0829  . multivitamin with minerals tablet 1 tablet  1 tablet Oral Daily Thermon Leylandavis, Laura A, NP   1 tablet at 06/08/17 0829  . nicotine (NICODERM CQ - dosed in mg/24 hours) patch 21 mg  21 mg Transdermal Daily Armandina StammerNwoko, Agnes I, NP   21 mg at 06/08/17 0618  . oxyCODONE-acetaminophen (PERCOCET/ROXICET) 5-325 MG per tablet 2 tablet  2 tablet Oral Q6H PRN Antonieta Pertlary, Blake Vetrano Lawson, MD   2 tablet at 06/08/17 1219  . pantoprazole (PROTONIX) EC tablet 40 mg  40 mg Oral Daily Fransisca Kaufmannavis, Laura A, NP   40 mg at 06/08/17 0827  . QUEtiapine (SEROQUEL) tablet 25 mg  25 mg Oral QHS Antonieta Pertlary, Jazzlin Clements Lawson, MD      . traZODone (DESYREL) tablet 100 mg  100 mg Oral QHS,MR X 1 Kerry HoughSimon, Spencer E, PA-C   100 mg at 06/07/17 2239    Lab Results: No results found for this or any previous visit (from the past 48 hour(s)).  Blood Alcohol level:  Lab Results  Component Value Date   [facility]  [facility]    <5        LOWEST DETECTABLE LIMIT FOR SERUM ALCOHOL IS 11 mg/dL FOR MEDICAL PURPOSES ONLY    Metabolic Disorder Labs: No results found for: HGBA1C, MPG No  results found for: PROLACTIN No results found for: CHOL, TRIG, HDL, CHOLHDL, VLDL, LDLCALC  Physical Findings: AIMS:  , ,  ,  ,  CIWA:    COWS:     Musculoskeletal: Strength & Muscle Tone: decreased Gait & Station: unsteady Patient leans: N/A  Psychiatric Specialty Exam: Physical Exam  Nursing note and vitals reviewed. Constitutional: She is oriented to person, place, and time. She appears well-developed and well-nourished.  HENT:  Head: Normocephalic and atraumatic.  Respiratory: Effort normal.  Neurological: She is alert and oriented to person, place, and time.    Review of Systems  Constitutional: Positive for malaise/fatigue.  Musculoskeletal: Positive for back pain and myalgias.  Psychiatric/Behavioral: Positive for depression. The patient is nervous/anxious and has insomnia.     Blood pressure 104/65, pulse 71, temperature 98.5 F (36.9 C), temperature source Oral, resp. rate 16, height 5' 1.5" (1.562 m), weight 65.8 kg (145 lb).Body mass index is 26.95 kg/m.  General Appearance: Disheveled  Eye Contact:  Fair  Speech:  Slow  Volume:  Decreased  Mood:  Depressed  Affect:  Congruent  Thought Process:  Coherent  Orientation:  Full (Time, Place, and Person)  Thought Content:  Logical  Suicidal Thoughts:  No  Homicidal Thoughts:  No  Memory:  Immediate;   Fair  Judgement:  Impaired  Insight:  Lacking  Psychomotor Activity:  Psychomotor Retardation  Concentration:  Concentration: Fair  Recall:  Fair  Fund of Knowledge:  Good  Language:  Good  Akathisia:  No  Handed:  Right  AIMS (if indicated):     Assets:  Desire for Improvement  ADL's:  Intact  Cognition:  WNL  Sleep:  Number of Hours: 6.5     Treatment Plan Summary: Daily contact with patient to assess and evaluate symptoms and progress in treatment, Medication management and Plan Patient is seen and examined.  Patient is a 52 year old female with the above-stated past psychiatric history seen in  follow-up.  She is not doing as well as she had been yesterday.  Her Cymbalta was just increased to 90 mg a day.  I am going to add Seroquel 25 mg p.o. nightly and titrate that for sleep as well as possible visual hallucinations.  We will continue with the psychosocial interventions.  We are trying her best to get her to accept responsibility and be more self determinate.  I thought we had made some progress yesterday, but she is regressed some.  Her insight into the situation limits her in being able to really appreciate what is going on.  No other changes in her medications, she will continue physical therapy as well.  Antonieta Pert, MD 06/08/2017, 2:46 PM

## 2017-06-09 MED ORDER — ARIPIPRAZOLE 5 MG PO TABS
5.0000 mg | ORAL_TABLET | Freq: Every day | ORAL | Status: DC
  Administered 2017-06-09 – 2017-06-10 (×2): 5 mg via ORAL
  Filled 2017-06-09: qty 7
  Filled 2017-06-09 (×4): qty 1

## 2017-06-09 NOTE — Progress Notes (Signed)
Adult Psychoeducational Group Note  Date:  06/09/2017 Time:  10:29 AM  Group Topic/Focus:  Goals Group:   The focus of this group is to help patients establish daily goals to achieve during treatment and discuss how the patient can incorporate goal setting into their daily lives to aide in recovery.  Participation Level:  Active  Participation Quality:  Appropriate  Affect:  Appropriate  Cognitive:  Appropriate  Insight: Appropriate  Engagement in Group:  Engaged  Modes of Intervention:  Rapport Building  Additional Comments:  Pt was active and engage in group.  Brooke [name] Brooke [name], Brooke [name] 06/09/2017, 10:29 AM

## 2017-06-09 NOTE — Progress Notes (Signed)
D.  Pt pleasant on approach, continued chronic pain.  Pt states that she needs another back surgery.  Pt is using a walker for ambulation at this time.  Pt denies SI/HI/AVH at this time.  A.  Support and encouragement offered, medication given as ordered  R.  Pt remains safe on the unit, will continue to monitor.

## 2017-06-09 NOTE — Progress Notes (Signed)
D Patient is observed UAL ambulating with her walker she tolerates this well. She endorses a flat, blunted affect. She speaks very slowly and softly. She attends her groups, is engaged in her recovery and is actively assessing ways she can develop healthier coping skills.  A She complete her daily assessment and on this she wrote she has experienced  SI today , she contracts with this writer to NOT hurt herself and she rates her depression, hopelessness and anxiety " " 7/5/7", respectively. She continues to receive scheduled MS contin (bid) and prn percocet ( 2 q 6 hr as needed) and received the percocet today at 0812 and 1357.   R Safety is in place. Pt is vebalizing ways she can identify that she has been emotionall stuck. Staff offered encouragement and fosters therapeutic relationship.

## 2017-06-09 NOTE — Progress Notes (Signed)
[facility] MD Progress Note  06/09/2017 2:26 PM Brooke [name]  MRN:  [id number] Subjective: Patient is seen and examined.  Patient is a 52 year old female with a past psychiatric history significant for major depression as well as generalized anxiety disorder and posttraumatic stress disorder.  She is seen in follow-up.  She actually looks a little bit better today.  She did talk with her father, but they did not discuss financial issues on living circumstances.  I think that was probably wise decision on her part.  He is concerned about her.  She felt positive about the conversation.  She did not sleep well last night, and she did take the Seroquel.  She prefers to stop the Seroquel and try something else.  She had been previously on Seroquel but gained weight on it.  She missed her physical therapy yesterday because she was in a group, and I told her to notify the nursing staff that if the physical therapist comes to visit that she should be excused from the group.  She stated she had some auditory hallucinations last night as well as some shadows.  We discussed the switch of the Seroquel to Abilify.  She stated she felt as though her mood was somewhat better.  She is getting around more today. Principal Problem: <principal problem not specified> Diagnosis:   Patient Active Problem List   Diagnosis Date Noted  . Generalized anxiety disorder [F41.1]   . Chronic pain syndrome [G89.4]   . MDD (major depressive disorder) [F32.9] 06/04/2017  . Suicidal ideation [R45.851] 06/02/2017  . MDD (major depressive disorder), recurrent severe, without psychosis (HCC) [F33.2]   . Dehydration [E86.0] 05/30/2017   Total Time spent with patient: 20 minutes  Past Psychiatric History: See admission H&P  Past Medical History:  Past Medical History:  Diagnosis Date  . Osteoarthritis     Past Surgical History:  Procedure Laterality Date  . BACK SURGERY    . knee replacement Left   . TOTAL KNEE ARTHROPLASTY      Family History: History reviewed. No pertinent family history. Family Psychiatric  History: See admission H&P Social History:  Social History   Substance and Sexual Activity  Alcohol Use Not Currently     Social History   Substance and Sexual Activity  Drug Use Not on file    Social History   Socioeconomic History  . Marital status: Single    Spouse name: Not on file  . Number of children: Not on file  . Years of education: Not on file  . Highest education level: Not on file  Occupational History  . Not on file  Social Needs  . Financial resource strain: Not on file  . Food insecurity:    Worry: Not on file    Inability: Not on file  . Transportation needs:    Medical: Not on file    Non-medical: Not on file  Tobacco Use  . Smoking status: Current Every Day Smoker    Packs/day: 2.00    Years: 29.00    Pack years: 58.00    Types: Cigarettes  . Smokeless tobacco: Never Used  Substance and Sexual Activity  . Alcohol use: Not Currently  . Drug use: Not on file  . Sexual activity: Not on file  Lifestyle  . Physical activity:    Days per week: Not on file    Minutes per session: Not on file  . Stress: Not on file  Relationships  . Social connections:  Talks on phone: Not on file    Gets together: Not on file    Attends religious service: Not on file    Active member of club or organization: Not on file    Attends meetings of clubs or organizations: Not on file    Relationship status: Not on file  Other Topics Concern  . Not on file  Social History Narrative  . Not on file   Additional Social History:                         Sleep: Fair  Appetite:  Fair  Current Medications: Current Facility-Administered Medications  Medication Dose Route Frequency Provider Last Rate Last Dose  . alum & mag hydroxide-simeth (MAALOX/MYLANTA) 200-200-20 MG/5ML suspension 30 mL  30 mL Oral Q6H PRN Fransisca Kaufmann A, NP      . ARIPiprazole (ABILIFY) tablet 5 mg   5 mg Oral QHS Antonieta Pert, MD      . busPIRone (BUSPAR) tablet 10 mg  10 mg Oral TID Antonieta Pert, MD   10 mg at 06/09/17 1150  . clonazePAM (KLONOPIN) tablet 0.5 mg  0.5 mg Oral QHS PRN Antonieta Pert, MD   0.5 mg at 06/08/17 2133  . dextromethorphan-guaiFENesin (MUCINEX DM) 30-600 MG per 12 hr tablet 1 tablet  1 tablet Oral BID Thermon Leyland, NP   1 tablet at 06/09/17 0810  . DULoxetine (CYMBALTA) DR capsule 90 mg  90 mg Oral Daily Antonieta Pert, MD   90 mg at 06/09/17 0809  . feeding supplement (BOOST / RESOURCE BREEZE) liquid 1 Container  1 Container Oral BID BM Thermon Leyland, NP   1 Container at 06/09/17 1123  . hydrOXYzine (ATARAX/VISTARIL) tablet 25 mg  25 mg Oral Q4H PRN Antonieta Pert, MD   25 mg at 06/08/17 2133  . loratadine (CLARITIN) tablet 10 mg  10 mg Oral Daily Fransisca Kaufmann A, NP   10 mg at 06/09/17 0809  . morphine (MS CONTIN) 12 hr tablet 15 mg  15 mg Oral Q12H Antonieta Pert, MD   15 mg at 06/09/17 0809  . multivitamin with minerals tablet 1 tablet  1 tablet Oral Daily Fransisca Kaufmann A, NP   1 tablet at 06/09/17 0809  . nicotine (NICODERM CQ - dosed in mg/24 hours) patch 21 mg  21 mg Transdermal Daily Armandina Stammer I, NP   21 mg at 06/09/17 9604  . oxyCODONE-acetaminophen (PERCOCET/ROXICET) 5-325 MG per tablet 2 tablet  2 tablet Oral Q6H PRN Antonieta Pert, MD   2 tablet at 06/09/17 1357  . pantoprazole (PROTONIX) EC tablet 40 mg  40 mg Oral Daily Fransisca Kaufmann A, NP   40 mg at 06/09/17 0809  . traZODone (DESYREL) tablet 100 mg  100 mg Oral QHS,MR X 1 Kerry Hough, PA-C   100 mg at 06/09/17 0002    Lab Results: No results found for this or any previous visit (from the past 48 hour(s)).  Blood Alcohol level:  Lab Results  Component Value Date   [facility]  10/10/2006    <5        LOWEST DETECTABLE LIMIT FOR SERUM ALCOHOL IS 11 mg/dL FOR MEDICAL PURPOSES ONLY    Metabolic Disorder Labs: No results found for: HGBA1C, MPG No results found  for: PROLACTIN No results found for: CHOL, TRIG, HDL, CHOLHDL, VLDL, LDLCALC  Physical Findings: AIMS:  , ,  ,  ,  CIWA:    COWS:     Musculoskeletal: Strength & Muscle Tone: abnormal Gait & Station: shuffle Patient leans: Front  Psychiatric Specialty Exam: Physical Exam  Nursing note and vitals reviewed.   Review of Systems  Musculoskeletal: Positive for back pain, joint pain, myalgias and neck pain.  Psychiatric/Behavioral: Positive for depression, hallucinations and suicidal ideas. The patient has insomnia.   All other systems reviewed and are negative.   Blood pressure 92/60, pulse 78, temperature 97.9 F (36.6 C), temperature source Oral, resp. rate 18, height 5' 1.5" (1.562 m), weight 65.8 kg (145 lb).Body mass index is 26.95 kg/m.  General Appearance: Casual  Eye Contact:  Fair  Speech:  Clear and Coherent  Volume:  Decreased  Mood:  Depressed  Affect:  Congruent  Thought Process:  Coherent  Orientation:  Full (Time, Place, and Person)  Thought Content:  Hallucinations: Auditory Visual  Suicidal Thoughts:  No  Homicidal Thoughts:  No  Memory:  Immediate;   Fair  Judgement:  Intact  Insight:  Fair  Psychomotor Activity:  Psychomotor Retardation  Concentration:  Concentration: Fair  Recall:  [employer]Fair  Fund of Knowledge:  Fair  Language:  Good  Akathisia:  No  Handed:  Right  AIMS (if indicated):     Assets:  Communication Skills Desire for Improvement Resilience  ADL's:  Intact  Cognition:  WNL  Sleep:  Number of Hours: 6.25     Treatment Plan Summary: Daily contact with patient to assess and evaluate symptoms and progress in treatment, Medication management and Plan Patient is seen and examined.  Patient's 52 year old female with the above-stated past psychiatric history seen in follow-up.  Her mood seems to be about 20-30% better than it had been.  We will continue the Cymbalta at its current dose.  She does not want to take Seroquel because in the past  it made her gain weight.  We will switch to Abilify.  Will start at 5 mg p.o. nightly.  Hopefully that will help her sleep as well.  She continues physical therapy, and she is moving around better today.  Apparently the physical therapist wants to get her a cane.  I am in agreement with that.  She also would like the permission to use the PNO on the floor, and I will tell the nurses that that is fine with me.  No other changes in her medications.  We will continue 15-minute checks, the psychosocial interventions that are currently in place, and hopefully she will continue to improve.Antonieta Pert.  Mikya Don Lawson Chasty Randal, MD 06/09/2017, 2:26 PM

## 2017-06-09 NOTE — Progress Notes (Signed)
D: Pt +ve SI/ AVH- contracts for safety. Pt is pleasant and cooperative. Pt stated she was stressed and depressed. Pt presents as if she does not want to leave.   A: Pt was offered support and encouragement. Pt was given scheduled medications. Pt was encourage to attend groups. Q 15 minute checks were done for safety.  R: safety maintained on unit.

## 2017-06-09 NOTE — Progress Notes (Signed)
Adult Psychoeducational Group Note  Date:  06/09/2017 Time:  5:03 AM  Group Topic/Focus:  Wrap-Up Group:   The focus of this group is to help patients review their daily goal of treatment and discuss progress on daily workbooks.  Participation Level:  Active  Participation Quality:  Appropriate  Affect:  Appropriate  Cognitive:  Appropriate  Insight: Appropriate  Engagement in Group:  Engaged  Modes of Intervention:  Discussion  Additional Comments:  Pt stated her goal for today was to talk with her physical therapist and Social worker about her treatment plan. Pt stated she did not accomplished her goal today but plan to accomplish it tomorrow. Pt rated her overall day a 3 out of 10.   Brooke [name] 06/09/2017, 5:03 AM

## 2017-06-09 NOTE — Plan of Care (Signed)
  Problem: Safety: Goal: Ability to identify and utilize support systems that promote safety will improve Outcome: Progressing Note:  Pt safe on the unit at this time

## 2017-06-09 NOTE — [facility] Group Notes (Signed)
LCSW Group Therapy Note  06/09/2017   9:30-10:30am (300 hall)                10:30-11:30am (400 hall)                11:30am-12:00pm (500 hall)  Type of Therapy and Topic:  Group Therapy: Anger Cues and Responses  Participation Level:  Active   Description of Group:   In this group, patients learned how to recognize the physical, cognitive, emotional, and behavioral responses they have to anger-provoking situations.  They identified a recent time they became angry and how they reacted.  They analyzed how their reaction was possibly beneficial and how it was possibly unhelpful.  The group discussed a variety of healthier coping skills that could help with such a situation in the future.  Deep breathing was practiced briefly.  Therapeutic Goals: 1. Patients will remember their last incident of anger and how they felt emotionally and physically, what their thoughts were at the time, and how they behaved. 2. Patients will identify how their behavior at that time worked for them, as well as how it worked against them. 3. Patients will explore possible new behaviors to use in future anger situations. 4. Patients will learn that anger itself is normal and cannot be eliminated, and that healthier reactions can assist with resolving conflict rather than worsening situations.  Summary of Patient Progress:  The patient shared that her most recent time of anger was yesterday and said her frustration with missing the Physical Therapist while she was meeting with a peer support counselor was very large, and then asking to see a Child psychotherapistsocial worker prior to 5pm and this not happening made her even more frustrated.  She has continued to do what the doctor asked her to do, which was to advocate for herself.  This CSW made a commitment to see her later in the day 1:1.  Therapeutic Modalities:   Cognitive Behavioral Therapy  Lynnell ChadMareida J Grossman-Orr  06/09/2017 12:00pm

## 2017-06-10 MED ORDER — MAGNESIUM HYDROXIDE 400 MG/5ML PO SUSP
5.0000 mL | Freq: Every day | ORAL | Status: DC | PRN
Start: 2017-06-10 — End: 2017-06-11
  Administered 2017-06-10: 5 mL via ORAL
  Filled 2017-06-10: qty 30

## 2017-06-10 NOTE — Progress Notes (Signed)
Adult Psychoeducational Group Note  Date:  06/10/2017 Time:  2:55 AM  Group Topic/Focus:  Wrap-Up Group:   The focus of this group is to help patients review their daily goal of treatment and discuss progress on daily workbooks.  Participation Level:  Minimal  Participation Quality:  Appropriate  Affect:  Appropriate and Flat  Cognitive:  Appropriate and Lacking  Insight: Appropriate, Lacking and Limited  Engagement in Group:  Limited  Modes of Intervention:  Discussion  Additional Comments:  Pt stated that her goal for today was to interact with her peers more and not isolate her self in her room. Pt stated that she felt she accomplished her goal today and felt good about it. Pt rated her overall day a 6 out of 10. Pt stated she attend all groups held today. Pt stated getting in touch with a family friend today that is keeping her cat while she is here help calm down a lot of anxiety.  Brooke FurnaceChristopher  Rondo [name] 06/10/2017, 2:55 AM

## 2017-06-10 NOTE — Progress Notes (Signed)
D: Pt denies SI/HI/, endorses AVH- improved. Pt is pleasant and cooperative. Pt sated her AVH were improved , pt continues to present with med-seeking behaviors.   A: Pt was offered support and encouragement. Pt was given scheduled medications. Pt was encourage to attend groups. Q 15 minute checks were done for safety.   R: safety maintained on unit.

## 2017-06-10 NOTE — [facility] Group Notes (Signed)
[facility] Group Notes:  (Nursing)  Date:  06/10/2017  Time: 1:15 PM Type of Therapy:  Nurse Education  Participation Level:  Active  Participation Quality:  Appropriate and Attentive  Affect:  Appropriate  Cognitive:  Alert and Appropriate  Insight:  Appropriate and Good  Engagement in Group:  Engaged  Modes of Intervention:  Discussion and Education  Summary of Progress/Problems: Patient was actively engaged in group- sharing with the group techniques she's learned for relaxation.  Shela NevinValerie S Raney Koeppen 06/10/2017, 3:02 PM

## 2017-06-10 NOTE — [facility] Group Notes (Signed)
[facility] LCSW Group Therapy Note  Date/Time:  06/10/2017 10AM Type of Therapy and Topic:  Group Therapy:  Healthy and Unhealthy Supports  Participation Level:  Did Not Attend   Description of Group:  Patients in this group were introduced to the idea of adding a variety of healthy supports to address the various needs in their lives.Patients discussed what additional healthy supports could be helpful in their recovery and wellness after discharge in order to prevent future hospitalizations.   An emphasis was placed on using counselor, doctor, therapy groups, 12-step groups, and problem-specific support groups to expand supports.  They also worked as a group on developing a specific plan for several patients to deal with unhealthy supports through boundary-setting, psychoeducation with loved ones, and even termination of relationships.   Therapeutic Goals:              1)  discuss importance of adding supports to stay well once out of the hospital             2)  compare healthy versus unhealthy supports and identify some examples of each             3)  generate ideas and descriptions of healthy supports that can be added             4)  offer mutual support about how to address unhealthy supports             5)  encourage active participation in and adherence to discharge plan               Summary of Patient Progress:   Pt was invited to attend group but chose not to attend. CSW will continue to encourage pt to attend group throughout their admission.    Therapeutic Modalities:   Motivational Interviewing Brief Solution-Focused Therapy  Heidi DachKelsey Lougenia Morrissey, MSW, LCSW 06/10/2017 11:05 AM

## 2017-06-10 NOTE — [facility] Counselor (Signed)
Clinical Social Work Note  At pt repeated request, met with her 1:1 to discuss her disability case, her housing, and her transportation at discharge tomorrow.  Advised her to give her lawyer all information about all ailments, physical and mental, in order to have the best representation for her disability case.  Explained why a hospital doctor will not write her a disability letter.  Gave her limited information about housing and where to apply.  Due to her limited mobility and medical issues, as well as walking with a walker and/or cane, told her that in all likelihood it would be approved for her to be given a taxi voucher to get her to her friend's home at discharge.  He only has a motorcycle and cannot provide transportation due to her condition(s).  Weekday CSW was informed about this.  Selmer Dominion, LCSW 06/10/2017, 5:10 PM

## 2017-06-10 NOTE — Plan of Care (Signed)
Nurse discussed depression, anxiety, coping skills with patient.  

## 2017-06-10 NOTE — Progress Notes (Signed)
D:  Patient's self inventory sheet, patient sleeps good, no sleep medication given.  Good appetite, normal energy level, good concentration.  Rated depression and anxiety #4, hopeless 3-4.  Denied withdrawals.  Denied SI.  Physical problems, lower back, 7-8.  Pain medication is helpful.  Goal is talking to SW, stay positive, pain under control, help with discharge plan, Discuss with Dr. Tamera Puntleary.  Plans to not isolate, talk to dad.  Had small BM today.  A:  Medications administered per MD orders.  Emotional support and encouragement given patient. R:  Patient denied SI and HI, contracts for safety.  Denied A/V hallucinations.  Safety maintained with 15 minute checks.

## 2017-06-10 NOTE — Plan of Care (Signed)
  Problem: Coping: Goal: Coping ability will improve Outcome: Progressing   Problem: Coping: Goal: Coping ability will improve Outcome: Progressing   Problem: Medication: Goal: Compliance with prescribed medication regimen will improve Outcome: Progressing

## 2017-06-10 NOTE — Progress Notes (Signed)
[facility] MD Progress Note  06/10/2017 3:23 PM Brooke [name]  MRN:  [id number] Subjective: Patient is seen and examined.  Patient is a 52 year old female with a past psychiatric history significant for major depression and generalized anxiety.  She is seen in follow-up.  She states she is feeling better.  He slept a little bit better last night but woke up because of pain.  She tolerated the Abilify without difficulty.  She denied any hallucinations today.  She denied any suicidal ideation today. Principal Problem: <principal problem not specified> Diagnosis:   Patient Active Problem List   Diagnosis Date Noted  . Generalized anxiety disorder [F41.1]   . Chronic pain syndrome [G89.4]   . MDD (major depressive disorder) [F32.9] 06/04/2017  . Suicidal ideation [R45.851] 06/02/2017  . MDD (major depressive disorder), recurrent severe, without psychosis (HCC) [F33.2]   . Dehydration [E86.0] 05/30/2017   Total Time spent with patient: 20 minutes  Past Psychiatric History: See previous H&P  Past Medical History:  Past Medical History:  Diagnosis Date  . Osteoarthritis     Past Surgical History:  Procedure Laterality Date  . BACK SURGERY    . knee replacement Left   . TOTAL KNEE ARTHROPLASTY     Family History: History reviewed. No pertinent family history. Family Psychiatric  History: See previous H&P Social History:  Social History   Substance and Sexual Activity  Alcohol Use Not Currently     Social History   Substance and Sexual Activity  Drug Use Not on file    Social History   Socioeconomic History  . Marital status: Single    Spouse name: Not on file  . Number of children: Not on file  . Years of education: Not on file  . Highest education level: Not on file  Occupational History  . Not on file  Social Needs  . Financial resource strain: Not on file  . Food insecurity:    Worry: Not on file    Inability: Not on file  . Transportation needs:    Medical: Not on file     Non-medical: Not on file  Tobacco Use  . Smoking status: Current Every Day Smoker    Packs/day: 2.00    Years: 29.00    Pack years: 58.00    Types: Cigarettes  . Smokeless tobacco: Never Used  Substance and Sexual Activity  . Alcohol use: Not Currently  . Drug use: Not on file  . Sexual activity: Not on file  Lifestyle  . Physical activity:    Days per week: Not on file    Minutes per session: Not on file  . Stress: Not on file  Relationships  . Social connections:    Talks on phone: Not on file    Gets together: Not on file    Attends religious service: Not on file    Active member of club or organization: Not on file    Attends meetings of clubs or organizations: Not on file    Relationship status: Not on file  Other Topics Concern  . Not on file  Social History Narrative  . Not on file   Additional Social History:                         Sleep: Fair  Appetite:  Fair  Current Medications: Current Facility-Administered Medications  Medication Dose Route Frequency Provider Last Rate Last Dose  . alum & mag hydroxide-simeth (MAALOX/MYLANTA) 200-200-20 MG/5ML suspension  30 mL  30 mL Oral Q6H PRN Fransisca Kaufmannavis, Laura A, NP      . ARIPiprazole (ABILIFY) tablet 5 mg  5 mg Oral QHS Antonieta Pertlary, Amoura Ransier Lawson, MD   5 mg at 06/09/17 2136  . busPIRone (BUSPAR) tablet 10 mg  10 mg Oral TID Antonieta Pertlary, Jandi Swiger Lawson, MD   10 mg at 06/10/17 1151  . clonazePAM (KLONOPIN) tablet 0.5 mg  0.5 mg Oral QHS PRN Antonieta Pertlary, Jerrit Horen Lawson, MD   0.5 mg at 06/09/17 2136  . dextromethorphan-guaiFENesin (MUCINEX DM) 30-600 MG per 12 hr tablet 1 tablet  1 tablet Oral BID Thermon Leylandavis, Laura A, NP   1 tablet at 06/10/17 0818  . DULoxetine (CYMBALTA) DR capsule 90 mg  90 mg Oral Daily Antonieta Pertlary, Shanterria Franta Lawson, MD   90 mg at 06/10/17 0818  . feeding supplement (BOOST / RESOURCE BREEZE) liquid 1 Container  1 Container Oral BID BM Thermon Leylandavis, Laura A, NP   1 Container at 06/10/17 1407  . hydrOXYzine (ATARAX/VISTARIL) tablet 25 mg  25  mg Oral Q4H PRN Antonieta Pertlary, Odessa Nishi Lawson, MD   25 mg at 06/08/17 2133  . loratadine (CLARITIN) tablet 10 mg  10 mg Oral Daily Fransisca Kaufmannavis, Laura A, NP   10 mg at 06/10/17 0818  . magnesium hydroxide (MILK OF MAGNESIA) suspension 5 mL  5 mL Oral Daily PRN Antonieta Pertlary, Keondria Siever Lawson, MD   5 mL at 06/10/17 1412  . morphine (MS CONTIN) 12 hr tablet 15 mg  15 mg Oral Q12H Antonieta Pertlary, Odella Appelhans Lawson, MD   15 mg at 06/10/17 16100821  . multivitamin with minerals tablet 1 tablet  1 tablet Oral Daily Thermon Leylandavis, Laura A, NP   1 tablet at 06/10/17 0818  . nicotine (NICODERM CQ - dosed in mg/24 hours) patch 21 mg  21 mg Transdermal Daily Armandina StammerNwoko, Agnes I, NP   21 mg at 06/10/17 0819  . oxyCODONE-acetaminophen (PERCOCET/ROXICET) 5-325 MG per tablet 2 tablet  2 tablet Oral Q6H PRN Antonieta Pertlary, Cailin Gebel Lawson, MD   2 tablet at 06/10/17 1154  . pantoprazole (PROTONIX) EC tablet 40 mg  40 mg Oral Daily Fransisca Kaufmannavis, Laura A, NP   40 mg at 06/10/17 0819  . traZODone (DESYREL) tablet 100 mg  100 mg Oral QHS,MR X 1 Kerry HoughSimon, Spencer E, PA-C   100 mg at 06/09/17 2136    Lab Results: No results found for this or any previous visit (from the past 48 hour(s)).  Blood Alcohol level:  Lab Results  Component Value Date   [facility]  [facility]    <5        LOWEST DETECTABLE LIMIT FOR SERUM ALCOHOL IS 11 mg/dL FOR MEDICAL PURPOSES ONLY    Metabolic Disorder Labs: No results found for: HGBA1C, MPG No results found for: PROLACTIN No results found for: CHOL, TRIG, HDL, CHOLHDL, VLDL, LDLCALC  Physical Findings: AIMS: Facial and Oral Movements Muscles of Facial Expression: None, normal Lips and Perioral Area: None, normal Jaw: None, normal Tongue: None, normal,Extremity Movements Upper (arms, wrists, hands, fingers): None, normal Lower (legs, knees, ankles, toes): None, normal, Trunk Movements Neck, shoulders, hips: None, normal, Overall Severity Severity of abnormal movements (highest score from questions above): None, normal Incapacitation due to abnormal movements:  None, normal Patient's awareness of abnormal movements (rate only patient's report): No Awareness, Dental Status Current problems with teeth and/or dentures?: No Does patient usually wear dentures?: No  CIWA:  CIWA-Ar Total: 1 COWS:  COWS Total Score: 1  Musculoskeletal: Strength & Muscle Tone: decreased Gait & Station:  shuffle Patient leans: N/A  Psychiatric Specialty Exam: Physical Exam  Constitutional: She is oriented to person, place, and time. She appears well-developed and well-nourished.  HENT:  Head: Normocephalic and atraumatic.  Respiratory: Effort normal.  Neurological: She is alert and oriented to person, place, and time.    Review of Systems  Musculoskeletal: Positive for back pain, joint pain, myalgias and neck pain.  Psychiatric/Behavioral: Positive for depression. The patient has insomnia.   All other systems reviewed and are negative.   Blood pressure 124/76, pulse 76, temperature 97.9 F (36.6 C), temperature source Oral, resp. rate 18, height 5' 1.5" (1.562 m), weight 65.8 kg (145 lb).Body mass index is 26.95 kg/m.  General Appearance: Casual  Eye Contact:  Fair  Speech:  Clear and Coherent  Volume:  Normal  Mood:  Depressed  Affect:  Congruent  Thought Process:  Coherent  Orientation:  Full (Time, Place, and Person)  Thought Content:  Logical  Suicidal Thoughts:  No  Homicidal Thoughts:  No  Memory:  Immediate;   Good  Judgement:  Fair  Insight:  Fair  Psychomotor Activity:  Normal  Concentration:  Concentration: Fair  Recall:  Good  Fund of Knowledge:  Good  Language:  Good  Akathisia:  No  Handed:  Right  AIMS (if indicated):     Assets:  Communication Skills Desire for Improvement Resilience  ADL's:  Intact  Cognition:  WNL  Sleep:  Number of Hours: 6.25     Treatment Plan Summary: Daily contact with patient to assess and evaluate symptoms and progress in treatment, Medication management and Plan Patient is seen and examined.  Patient  is a 52 year old female with the above-stated past psychiatric history seen in follow-up.  She continues to slowly improve.  We discussed the possibility of discharge either on Monday or Tuesday of this week.  We will discuss with social work and get her outpatient follow-up take care of.  Once that is in place and if her mood remains stable will be able to discharge her.  She is can go stay with a friend of hers after discharge.  She denied any suicidal ideation today or hallucinations.  Antonieta Pert, MD 06/10/2017, 3:23 PM

## 2017-06-10 NOTE — Progress Notes (Signed)
Adult Psychoeducational Group Note  Date:  06/10/2017 Time:  9:00 PM  Group Topic/Focus:  Wrap-Up Group:   The focus of this group is to help patients review their daily goal of treatment and discuss progress on daily workbooks.  Participation Level:  Did Not Attend  Participation Quality:  Did not attend  Affect:  Did not attend  Cognitive:  Did not attend  Insight: None  Engagement in Group:  Did not attend  Modes of Intervention:  Discussion  Additional Comments:  Pt did not attend group this evening.  Trusten Hume A Amol Domanski 06/10/2017, 9:00 PM

## 2017-06-11 DIAGNOSIS — R45851 Suicidal ideations: Secondary | ICD-10-CM | POA: Diagnosis not present

## 2017-06-11 DIAGNOSIS — Z96652 Presence of left artificial knee joint: Secondary | ICD-10-CM | POA: Diagnosis not present

## 2017-06-11 DIAGNOSIS — G894 Chronic pain syndrome: Secondary | ICD-10-CM | POA: Diagnosis not present

## 2017-06-11 DIAGNOSIS — F411 Generalized anxiety disorder: Secondary | ICD-10-CM | POA: Diagnosis not present

## 2017-06-11 DIAGNOSIS — F332 Major depressive disorder, recurrent severe without psychotic features: Secondary | ICD-10-CM | POA: Diagnosis not present

## 2017-06-11 MED ORDER — HYDROXYZINE HCL 25 MG PO TABS: 25.0000 mg | ORAL_TABLET | ORAL | 0 refills | Status: DC | PRN

## 2017-06-11 MED ORDER — ARIPIPRAZOLE 5 MG PO TABS: 5.0000 mg | ORAL_TABLET | Freq: Every day | ORAL | 0 refills | Status: DC

## 2017-06-11 MED ORDER — OXYCODONE-ACETAMINOPHEN 5-325 MG PO TABS: 2.0000 | ORAL_TABLET | Freq: Four times a day (QID) | ORAL | 0 refills | Status: DC | PRN

## 2017-06-11 MED ORDER — TRAZODONE HCL 100 MG PO TABS
100.0000 mg | ORAL_TABLET | Freq: Every evening | ORAL | Status: DC | PRN
  Filled 2017-06-11: qty 7

## 2017-06-11 MED ORDER — TRAZODONE HCL 100 MG PO TABS: 100.0000 mg | ORAL_TABLET | Freq: Every evening | ORAL | 0 refills | Status: DC | PRN

## 2017-06-11 MED ORDER — MORPHINE SULFATE ER 15 MG PO TBCR: 15.0000 mg | EXTENDED_RELEASE_TABLET | Freq: Two times a day (BID) | ORAL | 0 refills | Status: DC

## 2017-06-11 MED ORDER — CLONAZEPAM 0.5 MG PO TABS: 0.5000 mg | ORAL_TABLET | Freq: Every evening | ORAL | 0 refills | Status: DC | PRN

## 2017-06-11 MED ORDER — BUSPIRONE HCL 10 MG PO TABS: 10.0000 mg | ORAL_TABLET | Freq: Three times a day (TID) | ORAL | 0 refills | Status: DC

## 2017-06-11 MED ORDER — DULOXETINE HCL 30 MG PO CPEP: 90.0000 mg | ORAL_CAPSULE | Freq: Every day | ORAL | 0 refills | Status: DC

## 2017-06-11 NOTE — Progress Notes (Signed)
Recreation Therapy Notes  Date: 3.29.19 Time: 9:30 a.m. Location: [address] Dayroom   Group Topic: Stress Management   Goal Area(s) Addresses:  Goal 1.1: To reduce stress  -Patient will report feeling a reduction in stress level  -Patient will identify the importance of stress management  -Patient will participate during stress management group treatment     Intervention: Stress Management   Activity: Guided Imagery- Patients were in a peaceful environment with soft lighting enhancing patients mood. Patients were read a guided imagery script to help decrease stress levels   Education: Stress Management, Discharge Planning.    Education Outcome: Acknowledges edcuation/In group clarification offered/Needs additional education   Clinical Observations/Feedback:: Patient did not attend     Arnoldo Hildreth, Recreation Therapy Intern   Cortavius Montesinos 06/11/2017 9:17 AM 

## 2017-06-11 NOTE — Progress Notes (Signed)
Patient came back to [facility] in taxi, stating she did not receive all her medications at discharge.  Patient had been told several times by staff, that she would not receive samples of oxycodone, morphine, or klonipin.   Patient did received prescriptions for these medications.  Patient did receive prescriptions for her medications and also received samples for her medications but did NOT received samples of klonipin, oxycodone, morphine.  Patient received another taxi voucher to take her home.  Patient did apologize to staff.  MD/NP informed of patient's return to [facility].
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
## 2017-06-11 NOTE — Progress Notes (Addendum)
Physical Therapy Treatment Patient Details Name: Brooke [name] MRN: [id number] DOB: Nov 09, 1965 Today's Date: 06/11/2017    History of Present Illness 52 yo female admitted  06/04/17  with history depression, SI.  H/O L TKA and back surgery    PT Comments    Th patient is up adlib with RW. Provided SPC for patient, noted to be less steady with cane. Instructed patient to maintain use of Rw until Left knee is improved. Provided donor cane and RW for patient to take home.   Follow Up Recommendations  No PT follow up     Equipment Recommendations  Rolling walker with 5" wheels;Cane(PT provided donor equipment for patient to take home today.)  CANE IS AT THE NURSES DESK- IT IS BLACK. CNA IS AWARE OF IT'S LOCATION. RN NOT AVAILBLE. RN IS AWARE OF RW AND CANE FOR PATIENT TO TAKE  HOME.   Recommendations for Other Services       Precautions / Restrictions Precautions Precautions: Fall    Mobility  Bed Mobility Overal bed mobility: Independent                Transfers Overall transfer level: Modified independent                  Ambulation/Gait Ambulation/Gait assistance: Modified independent (Device/Increase time) Ambulation Distance (Feet): 200 Feet Assistive device: Rolling walker (2 wheeled);Straight cane Gait Pattern/deviations: Step-through pattern;Step-to pattern;Antalgic     General Gait Details: Rw i provides support required with increased pain reports. SPC was not as supportive, patient more antalgic on the right  leg.   Stairs            Wheelchair Mobility    Modified Rankin (Stroke Patients Only)       Balance                                            Cognition Arousal/Alertness: Awake/alert                                            Exercises      General Comments        Pertinent Vitals/Pain Pain Score: 7  Pain Location: left knee and back Pain Descriptors / Indicators:  Aching;Discomfort;Grimacing;Guarding Pain Intervention(s): Patient requesting pain meds-RN notified;Monitored during session;Limited activity within patient's tolerance    Home Living                      Prior Function            PT Goals (current goals can now be found in the care plan section) Progress towards PT goals: Progressing toward goals    Frequency    Min 1X/week      PT Plan Current plan remains appropriate    Co-evaluation              AM-PAC PT "6 Clicks" Daily Activity  Outcome Measure  Difficulty turning over in bed (including adjusting bedclothes, sheets and blankets)?: None Difficulty moving from lying on back to sitting on the side of the bed? : None Difficulty sitting down on and standing up from a chair with arms (e.g., wheelchair, bedside commode, etc,.)?: None Help needed moving to and from a bed to chair (  including a wheelchair)?: A Little Help needed walking in hospital room?: A Little Help needed climbing 3-5 steps with a railing? : Total 6 Click Score: 19    End of Session   Activity Tolerance: Patient limited by pain Patient left: (up in hall with staff) Nurse Communication: Mobility status;Patient requests pain meds PT Visit Diagnosis: Unsteadiness on feet (R26.81);Pain Pain - Right/Left: Left Pain - part of body: Knee     Time: 1100-1120 PT Time Calculation (min) (ACUTE ONLY): 20 min  Charges:  $Gait Training: 8-22 mins                    G CodesBlanchard [name]:       Brooke [name] PT [phone]    [address]Brooke [name] Brooke [name] 06/11/2017, 11:32 AM

## 2017-06-11 NOTE — Progress Notes (Signed)
Discharge Note:  Patient discharged to friend's home with taxi voucher.  Patient denied SI and HI.  Denied A/V hallucinations.  Patient stated she [facility].   Patient stated she received all her belongings, clothing, toiletries, misc items, etc.  Suicide prevention information given and discussed with patient who stated she understood and had no questions.  All required discharge information given to patient at discharge.

## 2017-06-11 NOTE — Progress Notes (Signed)
  [facility] :  Will you be returning to the same living situation after discharge:  No. Will be staying with friend. At discharge, do you have transportation home?: No. Taxi voucher provided. Do you have the ability to pay for your medications: No. Pt working to get medicaid restored.  [facility].  Release of information consent forms completed and in the chart;  Patient's signature needed at discharge.  Patient to Follow up at: Follow-up Information    [facility]. Go on 06/13/2017.   Why:  Intake appointment 06/13/17 at 8:00am. Contact information: [address] [place] [address] [phone]           Next level of care provider has access to [facility] Link:no  Safety Planning and Suicide Prevention discussed: No. Pt refused.  SPE completed with pt.     Has patient been referred to the Quitline?: Patient refused referral  Patient has been referred for addiction treatment: Yes  Lorri FrederickWierda, Aston Lawhorn Jon, LCSW 06/11/2017, 9:53 AM
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
## 2017-06-11 NOTE — Progress Notes (Signed)
Per pt request, CSW called Dorris Singhim Bradley, friend, [phone], to verify that pt will be staying with him after discharge.  Tim said he is expecting her. Garner NashGregory Zamira Hickam, MSW, LCSW Clinical Social Worker 06/11/2017 9:57 AM

## 2017-06-11 NOTE — Progress Notes (Signed)
D:  Patient's self inventory sheet, patient sleeps good, sleep medication helpful.  Good appetite, normal energy level, good concentration.  Rated depression and anxiety 6, hopeless 5.  Denied withdrawals.  Denied SI.  Physical problems, pain, worst pain in past 24 hours is #6, back, neck.  Pain medicine helpful.  Goal is discharge plan.  Plans to talk to SW/Dr. Tamera Puntleary.  No discharge plans. A:  Medications administered per MD orders.  Emotional support and encouragement given patient. R:  Denied SI and HI, contracts for safety.  Denied A/V hallucinations.  Safety maintained with 15 minute checks.   Patient continues to ask for pain medication often.

## 2017-06-11 NOTE — [facility] Suicide Risk Assessment (Signed)
[facility] INPATIENT:  Family/Significant Other Suicide Prevention Education  Suicide Prevention Education:  Patient Refusal for Family/Significant Other Suicide Prevention Education: The patient Brooke [name] has refused to provide written consent for family/significant other to be provided Family/Significant Other Suicide Prevention Education during admission and/or prior to discharge.  Physician notified.  Brooke [name], Brooke Hennes Jon, LCSW 06/11/2017, 8:33 AM

## 2017-06-29 ENCOUNTER — Other Ambulatory Visit: Payer: Self-pay

## 2017-06-29 ENCOUNTER — Emergency Department (HOSPITAL_COMMUNITY): Payer: Medicaid Other

## 2017-06-29 ENCOUNTER — Emergency Department (HOSPITAL_COMMUNITY)
Admission: EM | Admit: 2017-06-29 | Discharge: 2017-06-30 | Disposition: A | Discharge status: Home / Self Care | Payer: Medicaid Other | Attending: Emergency Medicine | Admitting: Emergency Medicine

## 2017-06-29 DIAGNOSIS — G8929 Other chronic pain: Secondary | ICD-10-CM | POA: Diagnosis not present

## 2017-06-29 DIAGNOSIS — R0789 Other chest pain: Secondary | ICD-10-CM | POA: Insufficient documentation

## 2017-06-29 DIAGNOSIS — Z79899 Other long term (current) drug therapy: Secondary | ICD-10-CM | POA: Insufficient documentation

## 2017-06-29 DIAGNOSIS — M542 Cervicalgia: Secondary | ICD-10-CM | POA: Insufficient documentation

## 2017-06-29 DIAGNOSIS — R079 Chest pain, unspecified: Secondary | ICD-10-CM

## 2017-06-29 DIAGNOSIS — F17210 Nicotine dependence, cigarettes, uncomplicated: Secondary | ICD-10-CM | POA: Insufficient documentation

## 2017-06-29 LAB — CBC
HCT: 42.3 % (ref 36.0–46.0)
Hemoglobin: 13.8 g/dL (ref 12.0–15.0)
MCH: 31.2 pg (ref 26.0–34.0)
MCHC: 32.6 g/dL (ref 30.0–36.0)
MCV: 95.7 fL (ref 78.0–100.0)
PLATELETS: 283 10*3/uL (ref 150–400)
RBC: 4.42 MIL/uL (ref 3.87–5.11)
RDW: 13.6 % (ref 11.5–15.5)
WBC: 9.5 10*3/uL (ref 4.0–10.5)

## 2017-06-29 LAB — BASIC METABOLIC PANEL
Anion gap: 12 (ref 5–15)
BUN: <5 mg/dL — ABNORMAL LOW (ref 6–20)
CALCIUM: 10.3 mg/dL (ref 8.9–10.3)
CHLORIDE: 106 mmol/L (ref 101–111)
CO2: 21 mmol/L — ABNORMAL LOW (ref 22–32)
CREATININE: 0.75 mg/dL (ref 0.44–1.00)
Glucose, Bld: 121 mg/dL — ABNORMAL HIGH (ref 65–99)
Potassium: 3.7 mmol/L (ref 3.5–5.1)
SODIUM: 139 mmol/L (ref 135–145)

## 2017-06-29 LAB — I-STAT BETA HCG BLOOD, ED (MC, WL, AP ONLY): HCG, QUANTITATIVE: 22.5 m[IU]/mL — AB (ref <5)

## 2017-06-29 LAB — I-STAT TROPONIN, ED: TROPONIN I, POC: 0 ng/mL (ref 0.00–0.08)

## 2017-06-29 NOTE — ED Triage Notes (Signed)
Patient states that she CP eariler today, then fell today and injured her back (states has plates and screws). States that it feels like a weight on her chest and that it "feels like something in my throat" and is unable to determine if it's related to anxiety. Patient is tremulous and mildly anxious in triage.

## 2017-06-30 ENCOUNTER — Emergency Department (HOSPITAL_COMMUNITY): Payer: Medicaid Other

## 2017-06-30 LAB — I-STAT TROPONIN, ED: Troponin i, poc: 0 ng/mL (ref 0.00–0.08)

## 2017-06-30 MED ORDER — TRAZODONE HCL 100 MG PO TABS: 100.0000 mg | ORAL_TABLET | Freq: Every evening | ORAL | 0 refills | Status: DC | PRN

## 2017-06-30 MED ORDER — ARIPIPRAZOLE 5 MG PO TABS: 5.0000 mg | ORAL_TABLET | Freq: Every day | ORAL | 0 refills | Status: DC

## 2017-06-30 MED ORDER — ONDANSETRON HCL 4 MG/2ML IJ SOLN: 4.0000 mg | Freq: Once | INTRAMUSCULAR | Status: DC

## 2017-06-30 MED ORDER — IBUPROFEN 400 MG PO TABS
600.0000 mg | ORAL_TABLET | Freq: Once | ORAL | Status: AC
  Administered 2017-06-30: 600 mg via ORAL
  Filled 2017-06-30: qty 1

## 2017-06-30 MED ORDER — BUSPIRONE HCL 10 MG PO TABS: 10.0000 mg | ORAL_TABLET | Freq: Three times a day (TID) | ORAL | 0 refills | Status: DC

## 2017-06-30 MED ORDER — OXYCODONE-ACETAMINOPHEN 5-325 MG PO TABS
1.0000 | ORAL_TABLET | Freq: Once | ORAL | Status: AC
  Administered 2017-06-30: 1 via ORAL
  Filled 2017-06-30: qty 1

## 2017-06-30 MED ORDER — DULOXETINE HCL 30 MG PO CPEP: 90.0000 mg | ORAL_CAPSULE | Freq: Every day | ORAL | 0 refills | Status: DC

## 2017-06-30 MED ORDER — HYDROXYZINE HCL 25 MG PO TABS: 25.0000 mg | ORAL_TABLET | ORAL | 0 refills | Status: DC | PRN

## 2017-06-30 NOTE — ED Notes (Signed)
Pt demonstrating a steady gait and appears in NAD.

## 2017-06-30 NOTE — ED Notes (Signed)
Pt was fully dressed and was pushing a chair with her belonging to the lobby. She then returned to her room after about 5 to 10 mins.

## 2017-06-30 NOTE — ED Notes (Signed)
Patient left at this time with all belongings. 

## 2017-07-01 NOTE — ED Provider Notes (Signed)
[facility] Provider Note   CSN: [id number] Arrival date & time: 06/29/17  [id number]     History   Chief Complaint Chief Complaint  Patient presents with  . Chest Pain    HPI [name] is a 52 y.o. female.  HPI 52 year old female presents the emergency department with complaints of chest discomfort earlier today followed by a mechanical fall resulting in increasing neck pain.  She has a long-standing history of chronic low back pain.  She is previously managed with opioid pain medications by pain physician outside this region.  She has lost her relationship with that provider and is currently without any pain medication.  Her chest discomfort was earlier today and felt like a heaviness in her chest.  It is been constant and continues to be present.  No significant associated shortness of breath.  No history of DVT or pulmonary embolism.  She reports a history of chest pain before and reports that her cardiologist wanted to perform a heart catheterization but she avoided it.  She denies fevers and chills.  No productive cough.  The fall today resulted in her falling down 2 steps.  She denies head injury.  Denies loss of consciousness.  No headache at this time.  Denies weakness of her arms or legs.  Her pain is located in her left lateral neck   Past Medical History:  Diagnosis Date  . Osteoarthritis     Patient Active Problem List   Diagnosis Date Noted  . Generalized anxiety disorder   . Chronic pain syndrome   . MDD (major depressive disorder) 06/04/2017  . Suicidal ideation 06/02/2017  . MDD (major depressive disorder), recurrent severe, without psychosis (HCC)   . Dehydration 05/30/2017    Past Surgical History:  Procedure Laterality Date  . BACK SURGERY    . knee replacement Left   . TOTAL KNEE ARTHROPLASTY       OB History   None      Home Medications    Prior to Admission medications   Medication Sig Start Date End  Date Taking? Authorizing Provider  clonazePAM (KLONOPIN) 0.5 MG tablet Take 1 tablet (0.5 mg total) by mouth at bedtime as needed (sleep). 06/11/17  Yes Money, Gerlene Burdockravis B, FNP  morphine (MS CONTIN) 15 MG 12 hr tablet Take 1 tablet (15 mg total) by mouth every 12 (twelve) hours. 06/11/17  Yes Money, Gerlene Burdockravis B, FNP  oxyCODONE-acetaminophen (PERCOCET/ROXICET) 5-325 MG tablet Take 2 tablets by mouth every 6 (six) hours as needed for moderate pain. 06/11/17  Yes Money, Gerlene Burdockravis B, FNP  ARIPiprazole (ABILIFY) 5 MG tablet Take 1 tablet (5 mg total) by mouth at bedtime. For mood control 06/30/17   Azalia Bilisampos, Jozi Malachi, MD  busPIRone (BUSPAR) 10 MG tablet Take 1 tablet (10 mg total) by mouth 3 (three) times daily. For mood control 06/30/17   Azalia Bilisampos, Ariel Wingrove, MD  DULoxetine (CYMBALTA) 30 MG capsule Take 3 capsules (90 mg total) by mouth daily. For mood control 06/30/17   Azalia Bilisampos, Towanna Avery, MD  hydrOXYzine (ATARAX/VISTARIL) 25 MG tablet Take 1 tablet (25 mg total) by mouth every 4 (four) hours as needed for anxiety (insomnia). 06/30/17   Azalia Bilisampos, Havilah Topor, MD  traZODone (DESYREL) 100 MG tablet Take 1 tablet (100 mg total) by mouth at bedtime as needed for sleep. 06/30/17   Azalia Bilisampos, Tamasha Laplante, MD    Family History No family history on file.  Social History Social History   Tobacco Use  . Smoking status: Current  Every Day Smoker    Packs/day: 2.00    Years: 29.00    Pack years: 58.00    Types: Cigarettes  . Smokeless tobacco: Never Used  Substance Use Topics  . Alcohol use: Not Currently  . Drug use: Not on file     Allergies   Iohexol and Sulfa antibiotics   Review of Systems Review of Systems  All other systems reviewed and are negative.    Physical Exam Updated Vital Signs BP 127/89   Pulse 74   Temp 98.3 F (36.8 C) (Oral)   Resp 18   Ht 5\' 3"  (1.6 m)   Wt 65.8 kg (145 lb)   SpO2 98%   BMI 25.69 kg/m   Physical Exam  Constitutional: She is oriented to person, place, and time. She appears well-developed and  well-nourished. No distress.  HENT:  Head: Normocephalic and atraumatic.  Eyes: EOM are normal.  Neck: Normal range of motion. Neck supple.  Cervical and paracervical tenderness without cervical step-off.  Cardiovascular: Normal rate, regular rhythm and normal heart sounds.  Pulmonary/Chest: Effort normal and breath sounds normal.  Abdominal: Soft. She exhibits no distension. There is no tenderness.  Musculoskeletal: Normal range of motion.  Full range of motion of major joints of bilateral upper and lower extremities.  No lumbar or thoracic spine tenderness.  Neurological: She is alert and oriented to person, place, and time.  Skin: Skin is warm and dry.  Psychiatric: She has a normal mood and affect. Judgment normal.  Nursing note and vitals reviewed.    ED Treatments / Results  Labs (all labs ordered are listed, but only abnormal results are displayed) Labs Reviewed  BASIC METABOLIC PANEL - Abnormal; Notable for the following components:      Result Value   CO2 21 (*)    Glucose, Bld 121 (*)    BUN <5 (*)    All other components within normal limits  I-STAT BETA HCG BLOOD, ED ([facility], [facility], [facility] ONLY) - Abnormal; Notable for the following components:   I-stat hCG, quantitative 22.5 (*)    All other components within normal limits  CBC  I-STAT TROPONIN, ED  I-STAT TROPONIN, ED    EKG EKG Interpretation  Date/Time:  Friday June 29 2017 19:22:24 EDT Ventricular Rate:  82 PR Interval:  136 QRS Duration: 72 QT Interval:  388 QTC Calculation: 453 R Axis:   79 Text Interpretation:  Normal sinus rhythm with sinus arrhythmia Normal ECG No significant change was found Confirmed by Azalia Bilis ([id number]) on 06/30/2017 12:55:43 AM   Radiology Dg Chest 2 View  Result Date: 06/29/2017 CLINICAL DATA:  Chest pain EXAM: CHEST - 2 VIEW COMPARISON:  05/30/2017 FINDINGS: Cardiac shadow is within normal limits. The lungs are well aerated bilaterally. No focal infiltrate or sizable effusion  is noted. No bony abnormality is noted. IMPRESSION: No active cardiopulmonary disease. Electronically Signed   By: Alcide Clever M.D.   On: 06/29/2017 20:19   Dg Cervical Spine Complete  Result Date: 06/30/2017 CLINICAL DATA:  Status post fall, with neck pain, acute onset. Initial encounter. EXAM: CERVICAL SPINE - COMPLETE 4+ VIEW COMPARISON:  Cervical spine radiographs performed 01/23/2010 FINDINGS: There is no evidence of fracture or subluxation. Mild disc space narrowing is noted along the lower cervical spine. Associated anterior and posterior disc osteophyte complexes are noted. The patient is status post anterior cervical spinal fusion at C6-C7. Vertebral bodies demonstrate normal height and alignment. Prevertebral soft tissues are within normal limits. The  provided odontoid view demonstrates no significant abnormality. The visualized lung apices are clear. IMPRESSION: 1. No evidence of fracture or subluxation along the cervical spine. 2. Status post anterior cervical spinal fusion at C6-C7. Mild degenerative change along the lower cervical spine. Electronically Signed   By: Roanna Raider M.D.   On: 06/30/2017 01:45    Procedures Procedures (including critical care time)  Medications Ordered in ED Medications  ibuprofen (ADVIL,MOTRIN) tablet 600 mg (600 mg Oral Given 06/30/17 0111)  oxyCODONE-acetaminophen (PERCOCET/ROXICET) 5-325 MG per tablet 1 tablet (1 tablet Oral Given 06/30/17 0112)  oxyCODONE-acetaminophen (PERCOCET/ROXICET) 5-325 MG per tablet 1 tablet (1 tablet Oral Given 06/30/17 0424)     Initial Impression / Assessment and Plan / ED Course  I have reviewed the triage vital signs and the nursing notes.  Pertinent labs & imaging results that were available during my care of the patient were reviewed by me and considered in my medical decision making (see chart for details).     Likely false positive i-STAT beta hCG.  This can be disregarded in this 52 year old female.   Mechanical fall today.  C-spine images personally reviewed and demonstrate no acute osseous abnormality.  No thoracic or lumbar point tenderness to suggest injury in that region.  And normal strength in her arms and legs.  In regards to her chest discomfort or chest discomfort has been constant.  Her troponin is negative x2.  Her EKG is without ischemic changes.  Patient is safe for discharge from the emergency department.  She is instructed to find a primary care physician and a pain physician in this region.  Her pain was treated in the emergency department.  I will be unable to prescribe any opioid pain medication for the patient given her history of chronic pain.  She is informed that this best comes from a primary care physician or pain specialist.  He is out of her other medications and requests refills.  I was happy to refill her other medications  Final Clinical Impressions(s) / ED Diagnoses   Final diagnoses:  Chest pain, unspecified type  Chronic neck pain    ED Discharge Orders        Ordered    ARIPiprazole (ABILIFY) 5 MG tablet  Daily at bedtime     06/30/17 0409    busPIRone (BUSPAR) 10 MG tablet  3 times daily     06/30/17 0409    DULoxetine (CYMBALTA) 30 MG capsule  Daily     06/30/17 0409    hydrOXYzine (ATARAX/VISTARIL) 25 MG tablet  Every 4 hours PRN     06/30/17 0409    traZODone (DESYREL) 100 MG tablet  At bedtime PRN     06/30/17 0409       Azalia Bilis, MD 07/01/17 [phone]

## 2017-07-09 ENCOUNTER — Other Ambulatory Visit: Payer: Self-pay | Admitting: *Deleted

## 2017-07-09 DIAGNOSIS — Z1231 Encounter for screening mammogram for malignant neoplasm of breast: Secondary | ICD-10-CM

## 2017-11-05 ENCOUNTER — Other Ambulatory Visit: Payer: Self-pay

## 2017-11-05 ENCOUNTER — Emergency Department (HOSPITAL_BASED_OUTPATIENT_CLINIC_OR_DEPARTMENT_OTHER)
Admission: EM | Admit: 2017-11-05 | Discharge: 2017-11-05 | Disposition: A | Discharge status: Home / Self Care | Payer: Medicaid Other | Attending: Emergency Medicine | Admitting: Emergency Medicine

## 2017-11-05 ENCOUNTER — Encounter (HOSPITAL_BASED_OUTPATIENT_CLINIC_OR_DEPARTMENT_OTHER): Payer: Self-pay | Admitting: *Deleted

## 2017-11-05 DIAGNOSIS — M7989 Other specified soft tissue disorders: Secondary | ICD-10-CM | POA: Insufficient documentation

## 2017-11-05 DIAGNOSIS — Z5321 Procedure and treatment not carried out due to patient leaving prior to being seen by health care provider: Secondary | ICD-10-CM | POA: Diagnosis not present

## 2017-11-05 NOTE — ED Triage Notes (Signed)
Called to take to treatment room  No response from lobby 

## 2017-11-05 NOTE — ED Triage Notes (Signed)
Her cat bit her left middle finger yesterday. Now her finger is red, swollen with yellow pus at the puncture site.

## 2017-11-05 NOTE — ED Notes (Signed)
Pt called  No response from lobby  

## 2017-11-07 NOTE — ED Notes (Signed)
Follow up call made  No answer  11/07/17   1132  s Kiyonna Tortorelli rn

## 2017-12-16 ENCOUNTER — Emergency Department (HOSPITAL_BASED_OUTPATIENT_CLINIC_OR_DEPARTMENT_OTHER)
Admission: EM | Admit: 2017-12-16 | Discharge: 2017-12-16 | Disposition: A | Discharge status: Home / Self Care | Payer: Medicaid Other | Attending: Emergency Medicine | Admitting: Emergency Medicine

## 2017-12-16 ENCOUNTER — Emergency Department (HOSPITAL_BASED_OUTPATIENT_CLINIC_OR_DEPARTMENT_OTHER): Payer: Medicaid Other

## 2017-12-16 ENCOUNTER — Other Ambulatory Visit: Payer: Self-pay

## 2017-12-16 ENCOUNTER — Encounter (HOSPITAL_BASED_OUTPATIENT_CLINIC_OR_DEPARTMENT_OTHER): Payer: Self-pay | Admitting: Emergency Medicine

## 2017-12-16 DIAGNOSIS — R609 Edema, unspecified: Secondary | ICD-10-CM | POA: Diagnosis present

## 2017-12-16 DIAGNOSIS — R601 Generalized edema: Secondary | ICD-10-CM

## 2017-12-16 DIAGNOSIS — R0602 Shortness of breath: Secondary | ICD-10-CM | POA: Diagnosis not present

## 2017-12-16 DIAGNOSIS — R5383 Other fatigue: Secondary | ICD-10-CM | POA: Diagnosis not present

## 2017-12-16 DIAGNOSIS — F17210 Nicotine dependence, cigarettes, uncomplicated: Secondary | ICD-10-CM | POA: Insufficient documentation

## 2017-12-16 DIAGNOSIS — Z96652 Presence of left artificial knee joint: Secondary | ICD-10-CM | POA: Insufficient documentation

## 2017-12-16 DIAGNOSIS — Z79899 Other long term (current) drug therapy: Secondary | ICD-10-CM | POA: Diagnosis not present

## 2017-12-16 HISTORY — DX: Other intervertebral disc degeneration, lumbar region without mention of lumbar back pain or lower extremity pain: M51.369

## 2017-12-16 HISTORY — DX: Other intervertebral disc degeneration, lumbar region: M51.36

## 2017-12-16 LAB — CBC
HCT: 37.3 % (ref 36.0–46.0)
Hemoglobin: 12.3 g/dL (ref 12.0–15.0)
MCH: 30.4 pg (ref 26.0–34.0)
MCHC: 33 g/dL (ref 30.0–36.0)
MCV: 92.1 fL (ref 78.0–100.0)
Platelets: 325 10*3/uL (ref 150–400)
RBC: 4.05 MIL/uL (ref 3.87–5.11)
RDW: 13.5 % (ref 11.5–15.5)
WBC: 11.3 10*3/uL — ABNORMAL HIGH (ref 4.0–10.5)

## 2017-12-16 LAB — COMPREHENSIVE METABOLIC PANEL
ALK PHOS: 116 U/L (ref 38–126)
ALT: 27 U/L (ref 0–44)
ANION GAP: 9 (ref 5–15)
AST: 29 U/L (ref 15–41)
Albumin: 4.1 g/dL (ref 3.5–5.0)
BUN: 10 mg/dL (ref 6–20)
CALCIUM: 9.4 mg/dL (ref 8.9–10.3)
CHLORIDE: 103 mmol/L (ref 98–111)
CO2: 26 mmol/L (ref 22–32)
CREATININE: 0.9 mg/dL (ref 0.44–1.00)
Glucose, Bld: 81 mg/dL (ref 70–99)
Potassium: 3.9 mmol/L (ref 3.5–5.1)
SODIUM: 138 mmol/L (ref 135–145)
Total Bilirubin: 0.5 mg/dL (ref 0.3–1.2)
Total Protein: 7.7 g/dL (ref 6.5–8.1)

## 2017-12-16 LAB — TROPONIN I

## 2017-12-16 LAB — BRAIN NATRIURETIC PEPTIDE: B NATRIURETIC PEPTIDE 5: 92.1 pg/mL (ref 0.0–100.0)

## 2017-12-16 MED ORDER — FUROSEMIDE 10 MG/ML IJ SOLN
20.0000 mg | Freq: Once | INTRAMUSCULAR | Status: AC
  Administered 2017-12-16: 20 mg via INTRAVENOUS
  Filled 2017-12-16: qty 2

## 2017-12-16 MED ORDER — LORAZEPAM 1 MG PO TABS
1.0000 mg | ORAL_TABLET | Freq: Once | ORAL | Status: AC
  Administered 2017-12-16: 1 mg via ORAL
  Filled 2017-12-16: qty 1

## 2017-12-16 NOTE — ED Notes (Signed)
Pt reports 21lb weight gain from 150lbs to 171lbs in 11day time frame.

## 2017-12-16 NOTE — ED Provider Notes (Signed)
[facility] EMERGENCY DEPARTMENT Provider Note   CSN: [id number] Arrival date & time: 12/16/17  1453     History   Chief Complaint Chief Complaint  Patient presents with  . Shortness of Breath  . Foot Pain    HPI Brooke [name] is a 52 y.o. female.  Patient c/o swelling to bilateral hands and feet/lower legs over course of past 1-2 months. Symptoms gradual onset, persistent, without acute/abrupt change today. Notes approximately 10-20 lb weight gain since onset symptoms. Denies hx same. Denies hx heart disease or chf. No hx renal or liver disease. Making normal amount urine. Denies nsaid use. Denies recent medication change other than being placed on suboxone as had been on chronic opiate pain medication. Denies chest pain or exertional chest pain. Mild doe/fatigue. No unilateral leg pain or swelling. No hx dvt or pe. No recent febrile or viral illness. No cough or uri symptoms.   The history is provided by the patient.  Shortness of Breath  Associated symptoms include leg swelling. Pertinent negatives include no fever, no headaches, no sore throat, no neck pain, no cough, no chest pain, no vomiting, no abdominal pain and no rash.  Foot Pain  Associated symptoms include shortness of breath. Pertinent negatives include no chest pain, no abdominal pain and no headaches.    Past Medical History:  Diagnosis Date  . DDD (degenerative disc disease), lumbar   . DDD (degenerative disc disease), lumbar   . Osteoarthritis     Patient Active Problem List   Diagnosis Date Noted  . Generalized anxiety disorder   . Chronic pain syndrome   . MDD (major depressive disorder) 06/04/2017  . Suicidal ideation 06/02/2017  . MDD (major depressive disorder), recurrent severe, without psychosis (HCC)   . Dehydration 05/30/2017    Past Surgical History:  Procedure Laterality Date  . BACK SURGERY    . knee replacement Left   . TOTAL KNEE ARTHROPLASTY       OB History   None       Home Medications    Prior to Admission medications   Medication Sig Start Date End Date Taking? Authorizing Provider  ARIPiprazole (ABILIFY) 5 MG tablet Take 1 tablet (5 mg total) by mouth at bedtime. For mood control 06/30/17   Azalia Bilis, MD  busPIRone (BUSPAR) 10 MG tablet Take 1 tablet (10 mg total) by mouth 3 (three) times daily. For mood control 06/30/17   Azalia Bilis, MD  clonazePAM (KLONOPIN) 0.5 MG tablet Take 1 tablet (0.5 mg total) by mouth at bedtime as needed (sleep). 06/11/17   Money, Gerlene Burdock, FNP  DULoxetine (CYMBALTA) 30 MG capsule Take 3 capsules (90 mg total) by mouth daily. For mood control 06/30/17   Azalia Bilis, MD  hydrOXYzine (ATARAX/VISTARIL) 25 MG tablet Take 1 tablet (25 mg total) by mouth every 4 (four) hours as needed for anxiety (insomnia). 06/30/17   Azalia Bilis, MD  morphine (MS CONTIN) 15 MG 12 hr tablet Take 1 tablet (15 mg total) by mouth every 12 (twelve) hours. 06/11/17   Money, Gerlene Burdock, FNP  oxyCODONE-acetaminophen (PERCOCET/ROXICET) 5-325 MG tablet Take 2 tablets by mouth every 6 (six) hours as needed for moderate pain. 06/11/17   Money, Gerlene Burdock, FNP  traZODone (DESYREL) 100 MG tablet Take 1 tablet (100 mg total) by mouth at bedtime as needed for sleep. 06/30/17   Azalia Bilis, MD    Family History No family history on file.  Social History Social History   Tobacco  Use  . Smoking status: Current Every Day Smoker    Packs/day: 2.00    Years: 29.00    Pack years: 58.00    Types: Cigarettes  . Smokeless tobacco: Never Used  Substance Use Topics  . Alcohol use: Not Currently  . Drug use: Never     Allergies   Iohexol and Sulfa antibiotics   Review of Systems Review of Systems  Constitutional: Negative for fever.  HENT: Negative for sore throat.   Eyes: Negative for redness.  Respiratory: Positive for shortness of breath. Negative for cough.   Cardiovascular: Positive for leg swelling. Negative for chest pain.  Gastrointestinal:  Negative for abdominal pain and vomiting.  Endocrine: Negative for polyuria.  Genitourinary: Negative for flank pain.  Musculoskeletal: Negative for back pain and neck pain.  Skin: Negative for rash.  Neurological: Negative for weakness, numbness and headaches.  Hematological: Does not bruise/bleed easily.  Psychiatric/Behavioral: Negative for confusion.     Physical Exam Updated Vital Signs BP 129/80 (BP Location: Left Arm)   Pulse 78   Temp 98.4 F (36.9 C) (Oral)   Resp 20   Ht 1.6 m (5\' 3" )   Wt 77.6 kg   SpO2 99%   BMI 30.30 kg/m   Physical Exam  Constitutional: She appears well-developed and well-nourished.  HENT:  Mouth/Throat: Oropharynx is clear and moist.  Eyes: Pupils are equal, round, and reactive to light. Conjunctivae are normal. No scleral icterus.  Neck: Neck supple. No JVD present. No tracheal deviation present.  Cardiovascular: Normal rate, regular rhythm, normal heart sounds and intact distal pulses. Exam reveals no gallop and no friction rub.  No murmur heard. Pulmonary/Chest: Effort normal and breath sounds normal. No respiratory distress.  Abdominal: Soft. Normal appearance and bowel sounds are normal. She exhibits no distension and no mass. There is no tenderness. There is no guarding.  Genitourinary:  Genitourinary Comments: No cva tenderness.   Musculoskeletal:  Mild bilateral arm/hand and foot/leg edema.   Neurological: She is alert.  Skin: Skin is warm and dry. No rash noted.  Psychiatric: She has a normal mood and affect.  Nursing note and vitals reviewed.    ED Treatments / Results  Labs (all labs ordered are listed, but only abnormal results are displayed) Results for orders placed or performed during the hospital encounter of 12/16/17  CBC  Result Value Ref Range   WBC 11.3 (H) 4.0 - 10.5 K/uL   RBC 4.05 3.87 - 5.11 MIL/uL   Hemoglobin 12.3 12.0 - 15.0 g/dL   HCT 16.1 09.6 - 04.5 %   MCV 92.1 78.0 - 100.0 fL   MCH 30.4 26.0 - 34.0  pg   MCHC 33.0 30.0 - 36.0 g/dL   RDW 40.9 81.1 - 91.4 %   Platelets 325 150 - 400 K/uL  Troponin I  Result Value Ref Range   Troponin I <0.03 <0.03 ng/mL  Comprehensive metabolic panel  Result Value Ref Range   Sodium 138 135 - 145 mmol/L   Potassium 3.9 3.5 - 5.1 mmol/L   Chloride 103 98 - 111 mmol/L   CO2 26 22 - 32 mmol/L   Glucose, Bld 81 70 - 99 mg/dL   BUN 10 6 - 20 mg/dL   Creatinine, Ser 7.82 0.44 - 1.00 mg/dL   Calcium 9.4 8.9 - 95.6 mg/dL   Total Protein 7.7 6.5 - 8.1 g/dL   Albumin 4.1 3.5 - 5.0 g/dL   AST 29 15 - 41 U/L   ALT  27 0 - 44 U/L   Alkaline Phosphatase 116 38 - 126 U/L   Total Bilirubin 0.5 0.3 - 1.2 mg/dL   GFR calc non Af Amer >60 >60 mL/min   GFR calc Af Amer >60 >60 mL/min   Anion gap 9 5 - 15  Brain natriuretic peptide  Result Value Ref Range   B Natriuretic Peptide 92.1 0.0 - 100.0 pg/mL   Dg Chest 2 View  Result Date: 12/16/2017 CLINICAL DATA:  Shortness of breath with abdominal and lower extremity swelling for 3-4 days. EXAM: CHEST - 2 VIEW COMPARISON:  PA and lateral chest 06/29/2017 and 05/30/2017. FINDINGS: There is pulmonary vascular congestion and mild cardiomegaly. No pneumothorax or pleural effusion. No consolidative process. No acute or focal bony abnormality. IMPRESSION: Mild cardiomegaly and pulmonary vascular congestion. Electronically Signed   By: Drusilla Kanner M.D.   On: 12/16/2017 15:48    EKG EKG Interpretation  Date/Time:  Sunday December 16 2017 15:12:36 EDT Ventricular Rate:  66 PR Interval:  122 QRS Duration: 76 QT Interval:  406 QTC Calculation: 425 R Axis:   90 Text Interpretation:  Normal sinus rhythm Rightward axis Nonspecific T wave abnormality Confirmed by Cathren Laine ([id number]) on 12/16/2017 6:35:09 PM   Radiology Dg Chest 2 View  Result Date: 12/16/2017 CLINICAL DATA:  Shortness of breath with abdominal and lower extremity swelling for 3-4 days. EXAM: CHEST - 2 VIEW COMPARISON:  PA and lateral chest  06/29/2017 and 05/30/2017. FINDINGS: There is pulmonary vascular congestion and mild cardiomegaly. No pneumothorax or pleural effusion. No consolidative process. No acute or focal bony abnormality. IMPRESSION: Mild cardiomegaly and pulmonary vascular congestion. Electronically Signed   By: Drusilla Kanner M.D.   On: 12/16/2017 15:48    Procedures Procedures (including critical care time)  Medications Ordered in ED Medications - No data to display   Initial Impression / Assessment and Plan / ED Course  I have reviewed the triage vital signs and the nursing notes.  Pertinent labs & imaging results that were available during my care of the patient were reviewed by me and considered in my medical decision making (see chart for details).  Labs sent. Iv. Cxr.  cxr reviewed - mild cm and vascular congestion. Lasix 20 mg iv.   Reviewed nursing notes and prior charts for additional history.   Labs reviewed - trop normal, renal fxn normal, bnp not elev.   Symptoms present for past couple months, without abrupt/acute change today.   Discussed labs and imaging w pt, and plan for outpt cardiology f/u and possible outpt echo for further eval.   Pt currently breathing comfortably/normally, rr 14, pulse ox 99% room air. Pt appears stable for d/c.     Final Clinical Impressions(s) / ED Diagnoses   Final diagnoses:  None    ED Discharge Orders    None       Cathren Laine, MD 12/16/17 2024

## 2017-12-16 NOTE — ED Triage Notes (Addendum)
Pt c/o shortness of breath, swelling to abdomen and B/L feet x 3-4 days. Pt reports swelling to fingers about 1-2 days. Pt reports that she has gained 20 lbs in 2 months, 10 lbs in last 2 weeks.

## 2017-12-16 NOTE — Discharge Instructions (Signed)
It was our pleasure to provide your ER care today - we hope that you feel better.  Elevate extremities, limit salt intake/low sodium diet.   Follow up with cardiologist in 1 week  - see referral - call office tomorrow to arrange appointment.   Also follow up with primary care doctor in the coming week.  Return [facility] ER if worse, new symptoms, increased trouble breathing, other concern.

## 2017-12-18 ENCOUNTER — Ambulatory Visit (INDEPENDENT_AMBULATORY_CARE_PROVIDER_SITE_OTHER): Payer: Medicaid Other | Admitting: Cardiology

## 2017-12-18 ENCOUNTER — Encounter: Payer: Self-pay | Admitting: Cardiology

## 2017-12-18 VITALS — BP 106/68 | HR 75 | Ht 62.0 in | Wt 169.1 lb

## 2017-12-18 DIAGNOSIS — R0609 Other forms of dyspnea: Secondary | ICD-10-CM

## 2017-12-18 DIAGNOSIS — F411 Generalized anxiety disorder: Secondary | ICD-10-CM

## 2017-12-18 DIAGNOSIS — R0789 Other chest pain: Secondary | ICD-10-CM | POA: Diagnosis not present

## 2017-12-18 NOTE — Progress Notes (Signed)
Cardiology Consultation:    Date:  12/18/2017   ID:  Brooke [name], DOB 06-16-1965, MRN [id number]  PCP:  Lavinia Sharps, NP  Cardiologist:  Gypsy Balsam, MD   Referring MD: Lavinia Sharps, NP   Chief Complaint  Patient presents with  . ER follow up  I have a chest pain  History of Present Illness:    Brooke [name] is a 52 y.o. female who is being seen today for the evaluation of chest pain at the request of Placey, Chales Abrahams, NP.  For last few weeks to months she started experiencing chest pain interestingly she had a horrible time trying to describe she tells me sensations about 5-6 however when asked her how long it is a class she told me she does not because she does not pay much attention to it.  Clearly this pain is not provoked by exercise.  Not relieved by rest.  That she does not have any sweating associated with this sensation just some shortness of breath.  She described the sensation as heavy sensation in the chest.  She also described to have some swelling of lower extremities.  In the matter-of-fact she ended up going to the emergency room because of the sensation that lasted for about few weeks and now is completely gone she describes swelling of her foot as well as seen her arm.  She cannot tell me if it is worse during the morning worse in the evening time.  It is gone now.  She went to the emergency room she was told to have congestive heart failure.  She is new Diagnosis of congestive heart failure in the Internet and now she is absolutely terrified with diagnosis. Years ago she was told to have microinfarction actually she was scheduled to have cardiac catheterization but never had it done. Denies having any paroxysmal nocturnal dyspnea.  No palpitations.  Past Medical History:  Diagnosis Date  . DDD (degenerative disc disease), lumbar   . DDD (degenerative disc disease), lumbar   . Osteoarthritis     Past Surgical History:  Procedure Laterality Date  . BACK  SURGERY    . knee replacement Left   . TOTAL KNEE ARTHROPLASTY      Current Medications: Current Meds  Medication Sig  . ARIPiprazole (ABILIFY) 5 MG tablet Take 1 tablet (5 mg total) by mouth at bedtime. For mood control  . Buprenorphine HCl-Naloxone HCl 12-3 MG FILM DISSOLVE 1 FILM UNT D  . busPIRone (BUSPAR) 10 MG tablet Take 1 tablet (10 mg total) by mouth 3 (three) times daily. For mood control  . clonazePAM (KLONOPIN) 0.5 MG tablet Take 1 tablet (0.5 mg total) by mouth at bedtime as needed (sleep). (Patient taking differently: Take 1 mg by mouth at bedtime as needed (sleep). )  . DULoxetine (CYMBALTA) 30 MG capsule Take 3 capsules (90 mg total) by mouth daily. For mood control  . gabapentin (NEURONTIN) 300 MG capsule Take 300 mg by mouth 2 (two) times daily.  . traZODone (DESYREL) 100 MG tablet Take 1 tablet (100 mg total) by mouth at bedtime as needed for sleep.     Allergies:   Iohexol and Sulfa antibiotics   Social History   Socioeconomic History  . Marital status: Single    Spouse name: Not on file  . Number of children: Not on file  . Years of education: Not on file  . Highest education level: Not on file  Occupational History  . Not  on file  Social Needs  . Financial resource strain: Not on file  . Food insecurity:    Worry: Not on file    Inability: Not on file  . Transportation needs:    Medical: Not on file    Non-medical: Not on file  Tobacco Use  . Smoking status: Current Every Day Smoker    Packs/day: 2.00    Years: 29.00    Pack years: 58.00    Types: Cigarettes  . Smokeless tobacco: Never Used  Substance and Sexual Activity  . Alcohol use: Not Currently  . Drug use: Never  . Sexual activity: Not on file  Lifestyle  . Physical activity:    Days per week: Not on file    Minutes per session: Not on file  . Stress: Not on file  Relationships  . Social connections:    Talks on phone: Not on file    Gets together: Not on file    Attends religious  service: Not on file    Active member of club or organization: Not on file    Attends meetings of clubs or organizations: Not on file    Relationship status: Not on file  Other Topics Concern  . Not on file  Social History Narrative  . Not on file     Family History: The patient's family history includes Breast cancer in her mother; Heart attack in her mother. ROS:   Please see the history of present illness.    All 14 point review of systems negative except as described per history of present illness.  EKGs/Labs/Other Studies Reviewed:    The following studies were reviewed today:   EKG:  EKG is  ordered today.  The ekg ordered today demonstrates normal sinus rhythm normal PR interval nonspecific ST-T segment changes.  Discharges are similar to the changes seen on echocardiogram from April.  Recent Labs: 05/31/2017: Magnesium 2.0 12/16/2017: ALT 27; B Natriuretic Peptide 92.1; BUN 10; Creatinine, Ser 0.90; Hemoglobin 12.3; Platelets 325; Potassium 3.9; Sodium 138  Recent Lipid Panel No results found for: CHOL, TRIG, HDL, CHOLHDL, VLDL, LDLCALC, LDLDIRECT  Physical Exam:    VS:  BP 106/68   Pulse 75   Ht 5\' 2"  (1.575 m)   Wt 169 lb 1.9 oz (76.7 kg)   SpO2 95%   BMI 30.93 kg/m     Wt Readings from Last 3 Encounters:  12/18/17 169 lb 1.9 oz (76.7 kg)  12/16/17 171 lb 1 oz (77.6 kg)  11/05/17 145 lb (65.8 kg)     GEN:  Well nourished, well developed in no acute distress HEENT: Normal NECK: No JVD; No carotid bruits LYMPHATICS: No lymphadenopathy CARDIAC: RRR, no murmurs, no rubs, no gallops RESPIRATORY:  Clear to auscultation without rales, wheezing or rhonchi  ABDOMEN: Soft, non-tender, non-distended MUSCULOSKELETAL:  No edema; No deformity  SKIN: Warm and dry NEUROLOGIC:  Alert and oriented x 3 PSYCHIATRIC:  Normal affect   ASSESSMENT:    1. Generalized anxiety disorder   2. Dyspnea on exertion   3. Atypical chest pain    PLAN:    In order of problems  listed above:  1. Dyspnea on exertion.  I will ask her to have an echocardiogram to assess left ventricular ejection fraction.  At the moment of my examination she does not have any signs and symptoms of congestive heart failure therefore I will not initiate any therapy for this problem.  We need to clarify the diagnosis.  Her last proBNP  was normal. 2. Atypical chest pain I will schedule her to have a stress test to make sure there is no inducible ischemia.  Also asked her to start taking one aspirin every single day. 3. Anxiety disorder followed by psychiatry as well as psychology.   Medication Adjustments/Labs and Tests Ordered: Current medicines are reviewed at length with the patient today.  Concerns regarding medicines are outlined above.  Orders Placed This Encounter  Procedures  . EKG 12-Lead  . ECHOCARDIOGRAM COMPLETE  . ECHOCARDIOGRAM STRESS TEST   No orders of the defined types were placed in this encounter.   Signed, Georgeanna Lea, MD, [facility]. 12/18/2017 9:48 AM    [facility]

## 2017-12-18 NOTE — Patient Instructions (Signed)
Medication Instructions:  Your physician recommends that you continue on your current medications as directed. Please refer to the Current Medication list given to you today.  If you need a refill on your cardiac medications before your next appointment, please call your pharmacy.   Lab work: None ordered If you have labs (blood work) drawn today and your tests are completely normal, you will receive your results only by: Marland Kitchen MyChart Message (if you have MyChart) OR . A paper copy in the mail If you have any lab test that is abnormal or we need to change your treatment, we will call you to review the results.  Testing/Procedures: Your physician has requested that you have an echocardiogram. Echocardiography is a painless test that uses sound waves to create images of your heart. It provides your doctor with information about the size and shape of your heart and how well your heart's chambers and valves are working. This procedure takes approximately one hour. There are no restrictions for this procedure.  Your physician has requested that you have a stress echocardiogram. For further information please visit [email or web address] Please follow instruction sheet as given.  Follow-Up: At [facility], you and your health needs are our priority.  As part of our continuing mission to provide you with exceptional heart care, we have created designated Provider Care Teams.  These Care Teams include your primary Cardiologist (physician) and Advanced Practice Providers (APPs -  Physician Assistants and Nurse Practitioners) who all work together to provide you with the care you need, when you need it. You will need a follow up appointment in 1 months.  You may see Gypsy Balsam, MD or another member of our [facility] Provider Team in [place]: Norman Herrlich, MD . Belva Crome, MD  Any Other Special Instructions Will Be Listed Below (If Applicable).

## 2017-12-26 ENCOUNTER — Other Ambulatory Visit (HOSPITAL_BASED_OUTPATIENT_CLINIC_OR_DEPARTMENT_OTHER): Payer: Self-pay

## 2017-12-26 ENCOUNTER — Other Ambulatory Visit: Payer: Self-pay

## 2017-12-26 ENCOUNTER — Observation Stay (HOSPITAL_BASED_OUTPATIENT_CLINIC_OR_DEPARTMENT_OTHER)
Admission: EM | Admit: 2017-12-26 | Discharge: 2017-12-28 | Disposition: A | Discharge status: Home / Self Care | Payer: Medicaid Other | Attending: Internal Medicine | Admitting: Internal Medicine

## 2017-12-26 ENCOUNTER — Encounter (HOSPITAL_BASED_OUTPATIENT_CLINIC_OR_DEPARTMENT_OTHER): Payer: Self-pay

## 2017-12-26 ENCOUNTER — Emergency Department (HOSPITAL_BASED_OUTPATIENT_CLINIC_OR_DEPARTMENT_OTHER): Payer: Medicaid Other

## 2017-12-26 DIAGNOSIS — E785 Hyperlipidemia, unspecified: Secondary | ICD-10-CM | POA: Diagnosis not present

## 2017-12-26 DIAGNOSIS — I509 Heart failure, unspecified: Secondary | ICD-10-CM | POA: Diagnosis not present

## 2017-12-26 DIAGNOSIS — Z882 Allergy status to sulfonamides status: Secondary | ICD-10-CM | POA: Insufficient documentation

## 2017-12-26 DIAGNOSIS — Z720 Tobacco use: Secondary | ICD-10-CM | POA: Diagnosis present

## 2017-12-26 DIAGNOSIS — R079 Chest pain, unspecified: Secondary | ICD-10-CM | POA: Diagnosis present

## 2017-12-26 DIAGNOSIS — F329 Major depressive disorder, single episode, unspecified: Secondary | ICD-10-CM | POA: Diagnosis not present

## 2017-12-26 DIAGNOSIS — Z79899 Other long term (current) drug therapy: Secondary | ICD-10-CM | POA: Insufficient documentation

## 2017-12-26 DIAGNOSIS — G894 Chronic pain syndrome: Secondary | ICD-10-CM | POA: Diagnosis present

## 2017-12-26 DIAGNOSIS — R0789 Other chest pain: Principal | ICD-10-CM | POA: Insufficient documentation

## 2017-12-26 DIAGNOSIS — M5136 Other intervertebral disc degeneration, lumbar region: Secondary | ICD-10-CM | POA: Insufficient documentation

## 2017-12-26 DIAGNOSIS — R0602 Shortness of breath: Secondary | ICD-10-CM | POA: Diagnosis not present

## 2017-12-26 DIAGNOSIS — F411 Generalized anxiety disorder: Secondary | ICD-10-CM | POA: Diagnosis present

## 2017-12-26 DIAGNOSIS — I2510 Atherosclerotic heart disease of native coronary artery without angina pectoris: Secondary | ICD-10-CM | POA: Insufficient documentation

## 2017-12-26 DIAGNOSIS — Z8249 Family history of ischemic heart disease and other diseases of the circulatory system: Secondary | ICD-10-CM | POA: Diagnosis not present

## 2017-12-26 DIAGNOSIS — D72829 Elevated white blood cell count, unspecified: Secondary | ICD-10-CM | POA: Diagnosis not present

## 2017-12-26 DIAGNOSIS — Z23 Encounter for immunization: Secondary | ICD-10-CM | POA: Insufficient documentation

## 2017-12-26 DIAGNOSIS — F17210 Nicotine dependence, cigarettes, uncomplicated: Secondary | ICD-10-CM | POA: Diagnosis not present

## 2017-12-26 HISTORY — DX: Heart failure, unspecified: I50.9

## 2017-12-26 LAB — CBC
HEMATOCRIT: 42.1 % (ref 36.0–46.0)
Hemoglobin: 13.5 g/dL (ref 12.0–15.0)
MCH: 29.7 pg (ref 26.0–34.0)
MCHC: 32.1 g/dL (ref 30.0–36.0)
MCV: 92.7 fL (ref 80.0–100.0)
NRBC: 0 % (ref 0.0–0.2)
PLATELETS: 338 10*3/uL (ref 150–400)
RBC: 4.54 MIL/uL (ref 3.87–5.11)
RDW: 13.2 % (ref 11.5–15.5)
WBC: 12 10*3/uL — AB (ref 4.0–10.5)

## 2017-12-26 LAB — BASIC METABOLIC PANEL
Anion gap: 9 (ref 5–15)
BUN: 8 mg/dL (ref 6–20)
CHLORIDE: 107 mmol/L (ref 98–111)
CO2: 23 mmol/L (ref 22–32)
CREATININE: 0.71 mg/dL (ref 0.44–1.00)
Calcium: 10 mg/dL (ref 8.9–10.3)
GFR calc non Af Amer: >60 mL/min (ref >60)
Glucose, Bld: 102 mg/dL — ABNORMAL HIGH (ref 70–99)
Potassium: 4.1 mmol/L (ref 3.5–5.1)
SODIUM: 139 mmol/L (ref 135–145)

## 2017-12-26 LAB — BRAIN NATRIURETIC PEPTIDE: B Natriuretic Peptide: 21.7 pg/mL (ref 0.0–100.0)

## 2017-12-26 LAB — TROPONIN I: Troponin I: <0.03 ng/mL (ref <0.03)

## 2017-12-26 LAB — D-DIMER, QUANTITATIVE: D-Dimer, Quant: 0.45 ug/mL-FEU (ref 0.00–0.50)

## 2017-12-26 MED ORDER — FENTANYL CITRATE (PF) 100 MCG/2ML IJ SOLN
50.0000 ug | Freq: Once | INTRAMUSCULAR | Status: AC
  Administered 2017-12-26: 50 ug via INTRAVENOUS
  Filled 2017-12-26: qty 2

## 2017-12-26 MED ORDER — ASPIRIN 81 MG PO CHEW
324.0000 mg | CHEWABLE_TABLET | Freq: Once | ORAL | Status: AC
  Administered 2017-12-26: 324 mg via ORAL
  Filled 2017-12-26: qty 4

## 2017-12-26 NOTE — Progress Notes (Signed)
52 year old female who presents with recurrent precordial chest pain, negative troponins, chronic lateral T wave inversions.  She was supposed to have a outpatient stress test but had recurrent, persistent and severe chest pain that prompted her to come to the emergency department.   Admitted under observation to rule out acute coronary syndrome

## 2017-12-26 NOTE — ED Notes (Signed)
Patient transported to X-ray 

## 2017-12-26 NOTE — ED Provider Notes (Signed)
[facility] EMERGENCY DEPARTMENT Provider Note   CSN: [id number] Arrival date & time: 12/26/17  1921     History   Chief Complaint Chief Complaint  Patient presents with  . Chest Pain    HPI Brooke [name] is a 52 y.o. female.  The history is provided by the patient.  Chest Pain   This is a new problem. The current episode started 3 to 5 hours ago. The problem occurs every several days. The problem has not changed since onset.The pain is associated with rest and exertion. The pain is present in the substernal region. The pain is at a severity of 3/10. The pain is moderate. The pain does not radiate. Associated symptoms include malaise/fatigue, orthopnea and shortness of breath. Pertinent negatives include no abdominal pain, no back pain, no claudication, no cough, no exertional chest pressure, no fever, no hemoptysis, no lower extremity edema, no nausea, no numbness, no palpitations, no syncope and no vomiting.  Pertinent negatives for past medical history include no CAD, no CHF, no hyperlipidemia, no hypertension, no PE and no seizures.  Her family medical history is significant for CAD.    Past Medical History:  Diagnosis Date  . CHF (congestive heart failure) (HCC)   . DDD (degenerative disc disease), lumbar   . DDD (degenerative disc disease), lumbar   . Osteoarthritis     Patient Active Problem List   Diagnosis Date Noted  . Chest pain 12/26/2017  . Dyspnea on exertion 12/18/2017  . Atypical chest pain 12/18/2017  . Generalized anxiety disorder   . Chronic pain syndrome   . MDD (major depressive disorder) 06/04/2017  . Suicidal ideation 06/02/2017  . MDD (major depressive disorder), recurrent severe, without psychosis (HCC)   . Dehydration 05/30/2017    Past Surgical History:  Procedure Laterality Date  . BACK SURGERY    . knee replacement Left   . TOTAL KNEE ARTHROPLASTY       OB History   None      Home Medications    Prior to Admission  medications   Medication Sig Start Date End Date Taking? Authorizing Provider  ARIPiprazole (ABILIFY) 5 MG tablet Take 1 tablet (5 mg total) by mouth at bedtime. For mood control 06/30/17   Azalia Bilis, MD  Buprenorphine HCl-Naloxone HCl 12-3 MG FILM DISSOLVE 1 FILM UNT D 11/24/17   [provider]  busPIRone (BUSPAR) 10 MG tablet Take 1 tablet (10 mg total) by mouth 3 (three) times daily. For mood control 06/30/17   Azalia Bilis, MD  clonazePAM (KLONOPIN) 0.5 MG tablet Take 1 tablet (0.5 mg total) by mouth at bedtime as needed (sleep). Patient taking differently: Take 1 mg by mouth at bedtime as needed (sleep).  06/11/17   Money, Gerlene Burdock, FNP  DULoxetine (CYMBALTA) 30 MG capsule Take 3 capsules (90 mg total) by mouth daily. For mood control 06/30/17   Azalia Bilis, MD  gabapentin (NEURONTIN) 300 MG capsule Take 300 mg by mouth 2 (two) times daily.    [provider]  traZODone (DESYREL) 100 MG tablet Take 1 tablet (100 mg total) by mouth at bedtime as needed for sleep. 06/30/17   Azalia Bilis, MD    Family History Family History  Problem Relation Age of Onset  . Heart attack Mother   . Breast cancer Mother     Social History Social History   Tobacco Use  . Smoking status: Current Every Day Smoker    Packs/day: 2.00    Years:  29.00    Pack years: 58.00    Types: Cigarettes  . Smokeless tobacco: Never Used  Substance Use Topics  . Alcohol use: Not Currently  . Drug use: Never     Allergies   Iohexol and Sulfa antibiotics   Review of Systems Review of Systems  Constitutional: Positive for malaise/fatigue. Negative for chills and fever.  HENT: Negative for ear pain and sore throat.   Eyes: Negative for pain and visual disturbance.  Respiratory: Positive for shortness of breath. Negative for cough and hemoptysis.   Cardiovascular: Positive for chest pain and orthopnea. Negative for palpitations, claudication and syncope.  Gastrointestinal: Negative for  abdominal pain, nausea and vomiting.  Genitourinary: Negative for dysuria and hematuria.  Musculoskeletal: Negative for arthralgias and back pain.  Skin: Negative for color change and rash.  Neurological: Negative for seizures, syncope and numbness.  All other systems reviewed and are negative.    Physical Exam Updated Vital Signs  ED Triage Vitals  Enc Vitals Group     BP 12/26/17 1925 132/81     Pulse Rate 12/26/17 1925 89     Resp 12/26/17 1925 18     Temp 12/26/17 1925 98.9 F (37.2 C)     Temp Source 12/26/17 1925 Oral     SpO2 12/26/17 1925 99 %     Weight --      Height --      Head Circumference --      Peak Flow --      Pain Score 12/26/17 1926 8     Pain Loc --      Pain Edu? --      Excl. in GC? --     Physical Exam  Constitutional: She is oriented to person, place, and time. She appears well-developed and well-nourished. No distress.  HENT:  Head: Normocephalic and atraumatic.  Eyes: Pupils are equal, round, and reactive to light. Conjunctivae and EOM are normal.  Neck: Neck supple.  Cardiovascular: Normal rate, regular rhythm, intact distal pulses and normal pulses.  No murmur heard. Pulmonary/Chest: Effort normal and breath sounds normal. No respiratory distress. She has no decreased breath sounds. She has no wheezes. She has no rales.  Abdominal: Soft. There is no tenderness.  Musculoskeletal: She exhibits no edema.       Right lower leg: She exhibits no edema.       Left lower leg: She exhibits no edema.  Neurological: She is alert and oriented to person, place, and time. No cranial nerve deficit.  Skin: Skin is warm and dry. Capillary refill takes less than 2 seconds.  Psychiatric: She has a normal mood and affect.  Nursing note and vitals reviewed.    ED Treatments / Results  Labs (all labs ordered are listed, but only abnormal results are displayed) Labs Reviewed  BASIC METABOLIC PANEL - Abnormal; Notable for the following components:       Result Value   Glucose, Bld 102 (*)    All other components within normal limits  CBC - Abnormal; Notable for the following components:   WBC 12.0 (*)    All other components within normal limits  TROPONIN I  BRAIN NATRIURETIC PEPTIDE  D-DIMER, QUANTITATIVE (NOT AT [facility])    EKG EKG Interpretation  Date/Time:  Wednesday December 26 2017 19:29:27 EDT Ventricular Rate:  98 PR Interval:  136 QRS Duration: 72 QT Interval:  346 QTC Calculation: 441 R Axis:   148 Text Interpretation:  Normal sinus rhythm Right axis  deviation Possible Right ventricular hypertrophy Nonspecific T wave abnormality Abnormal ECG Confirmed by Virgina Norfolk [phone]) on 12/26/2017 7:32:53 PM   Radiology Dg Chest 2 View  Result Date: 12/26/2017 CLINICAL DATA:  Chest pain EXAM: CHEST - 2 VIEW COMPARISON:  12/16/2017 FINDINGS: Postsurgical changes at the cervicothoracic junction. Mild cardiomegaly with vascular congestion. Mild diffuse chronic interstitial opacity. No focal airspace disease or pneumothorax. IMPRESSION: Cardiomegaly with mild vascular congestion. Electronically Signed   By: Jasmine Pang M.D.   On: 12/26/2017 21:00    Procedures Procedures (including critical care time)  Medications Ordered in ED Medications  aspirin chewable tablet 324 mg (324 mg Oral Given 12/26/17 2057)  fentaNYL (SUBLIMAZE) injection 50 mcg (50 mcg Intravenous Given 12/26/17 2148)     Initial Impression / Assessment and Plan / ED Course  I have reviewed the triage vital signs and the nursing notes.  Pertinent labs & imaging results that were available during my care of the patient were reviewed by me and considered in my medical decision making (see chart for details).     Brooke [name] is a 52 year old female with no significant medical history presents to the ED with chest pain.  Patient with normal vitals.  No fever.  EKG upon arrival shows no new ischemic changes.  She has T waves that have been there before.   Patient recently saw cardiology and they ordered an outpatient stress test.  She continues to have chest pain, shortness of breath both at rest and with exertion.  There does appear to be an anxiety component.  Patient with overall unremarkable exam.  No signs of volume overload, clear breath sounds.  Troponin negative.  BNP negative.  D-dimer negative doubt PE.  No significant anemia, electrolyte abnormality, kidney injury.  Chest x-ray with no signs of pneumonia, pneumothorax, pleural effusion.  Contacted Dr. Mayford Knife with cardiology for further recommendations and after discussion with cardiology and the patient will admit to hospitalist service at [facility] for further ACS rule out with additional troponins and stress test.  Patient remained hemodynamically stable throughout my care.  She was given aspirin and fentanyl with improvement of pain.  This chart was dictated using voice recognition software.  Despite best efforts to proofread,  errors can occur which can change the documentation meaning.  Final Clinical Impressions(s) / ED Diagnoses   Final diagnoses:  Chest pain, unspecified type    ED Discharge Orders    None       Virgina Norfolk, DO 12/27/17 0012

## 2017-12-26 NOTE — ED Notes (Signed)
C/o midsternal cp sharp and dull x 2 weeks  Worse after lunch today  Also bilateral calf pain x 6 hours

## 2017-12-26 NOTE — ED Triage Notes (Signed)
C/o CP, SOB x 2 days-syncope x 1 yesterday-states she was recently dx with CHF-NAD-steady gait

## 2017-12-27 ENCOUNTER — Observation Stay (HOSPITAL_BASED_OUTPATIENT_CLINIC_OR_DEPARTMENT_OTHER): Payer: Medicaid Other

## 2017-12-27 ENCOUNTER — Encounter (HOSPITAL_COMMUNITY): Payer: Self-pay | Admitting: *Deleted

## 2017-12-27 ENCOUNTER — Other Ambulatory Visit: Payer: Self-pay

## 2017-12-27 ENCOUNTER — Other Ambulatory Visit (HOSPITAL_BASED_OUTPATIENT_CLINIC_OR_DEPARTMENT_OTHER): Payer: Self-pay

## 2017-12-27 DIAGNOSIS — F329 Major depressive disorder, single episode, unspecified: Secondary | ICD-10-CM | POA: Diagnosis not present

## 2017-12-27 DIAGNOSIS — F411 Generalized anxiety disorder: Secondary | ICD-10-CM | POA: Diagnosis not present

## 2017-12-27 DIAGNOSIS — E785 Hyperlipidemia, unspecified: Secondary | ICD-10-CM | POA: Diagnosis not present

## 2017-12-27 DIAGNOSIS — F17210 Nicotine dependence, cigarettes, uncomplicated: Secondary | ICD-10-CM | POA: Diagnosis not present

## 2017-12-27 DIAGNOSIS — R079 Chest pain, unspecified: Secondary | ICD-10-CM

## 2017-12-27 DIAGNOSIS — R0602 Shortness of breath: Secondary | ICD-10-CM | POA: Diagnosis not present

## 2017-12-27 DIAGNOSIS — R0789 Other chest pain: Secondary | ICD-10-CM | POA: Diagnosis not present

## 2017-12-27 DIAGNOSIS — D72829 Elevated white blood cell count, unspecified: Secondary | ICD-10-CM | POA: Diagnosis not present

## 2017-12-27 DIAGNOSIS — I509 Heart failure, unspecified: Secondary | ICD-10-CM | POA: Diagnosis not present

## 2017-12-27 DIAGNOSIS — F333 Major depressive disorder, recurrent, severe with psychotic symptoms: Secondary | ICD-10-CM

## 2017-12-27 DIAGNOSIS — R072 Precordial pain: Secondary | ICD-10-CM | POA: Diagnosis not present

## 2017-12-27 DIAGNOSIS — Z720 Tobacco use: Secondary | ICD-10-CM | POA: Diagnosis present

## 2017-12-27 DIAGNOSIS — Z882 Allergy status to sulfonamides status: Secondary | ICD-10-CM | POA: Diagnosis not present

## 2017-12-27 DIAGNOSIS — G894 Chronic pain syndrome: Secondary | ICD-10-CM | POA: Diagnosis not present

## 2017-12-27 DIAGNOSIS — Z23 Encounter for immunization: Secondary | ICD-10-CM | POA: Diagnosis not present

## 2017-12-27 DIAGNOSIS — I2510 Atherosclerotic heart disease of native coronary artery without angina pectoris: Secondary | ICD-10-CM | POA: Diagnosis not present

## 2017-12-27 DIAGNOSIS — M5136 Other intervertebral disc degeneration, lumbar region: Secondary | ICD-10-CM | POA: Diagnosis not present

## 2017-12-27 DIAGNOSIS — Z79899 Other long term (current) drug therapy: Secondary | ICD-10-CM | POA: Diagnosis not present

## 2017-12-27 DIAGNOSIS — Z8249 Family history of ischemic heart disease and other diseases of the circulatory system: Secondary | ICD-10-CM | POA: Diagnosis not present

## 2017-12-27 LAB — TROPONIN I
Troponin I: <0.03 ng/mL (ref <0.03)
Troponin I: <0.03 ng/mL (ref <0.03)
Troponin I: <0.03 ng/mL (ref <0.03)

## 2017-12-27 LAB — HEMOGLOBIN A1C
HEMOGLOBIN A1C: 6.1 % — AB (ref 4.8–5.6)
MEAN PLASMA GLUCOSE: 128.37 mg/dL

## 2017-12-27 LAB — BASIC METABOLIC PANEL
ANION GAP: 13 (ref 5–15)
BUN: 7 mg/dL (ref 6–20)
CALCIUM: 9.5 mg/dL (ref 8.9–10.3)
CO2: 22 mmol/L (ref 22–32)
CREATININE: 0.84 mg/dL (ref 0.44–1.00)
Chloride: 104 mmol/L (ref 98–111)
GFR calc non Af Amer: >60 mL/min (ref >60)
Glucose, Bld: 87 mg/dL (ref 70–99)
Potassium: 3.9 mmol/L (ref 3.5–5.1)
SODIUM: 139 mmol/L (ref 135–145)

## 2017-12-27 LAB — LIPID PANEL
CHOLESTEROL: 210 mg/dL — AB (ref 0–200)
HDL: 38 mg/dL — AB (ref >40)
LDL Cholesterol: 121 mg/dL — ABNORMAL HIGH (ref 0–99)
TRIGLYCERIDES: 255 mg/dL — AB (ref <150)
Total CHOL/HDL Ratio: 5.5 RATIO
VLDL: 51 mg/dL — ABNORMAL HIGH (ref 0–40)

## 2017-12-27 LAB — MRSA PCR SCREENING: MRSA by PCR: POSITIVE — AB

## 2017-12-27 LAB — CBC
HEMATOCRIT: 43.9 % (ref 36.0–46.0)
HEMOGLOBIN: 13.8 g/dL (ref 12.0–15.0)
MCH: 29.1 pg (ref 26.0–34.0)
MCHC: 31.4 g/dL (ref 30.0–36.0)
MCV: 92.6 fL (ref 80.0–100.0)
NRBC: 0 % (ref 0.0–0.2)
Platelets: 357 10*3/uL (ref 150–400)
RBC: 4.74 MIL/uL (ref 3.87–5.11)
RDW: 13.2 % (ref 11.5–15.5)
WBC: 12.3 10*3/uL — AB (ref 4.0–10.5)

## 2017-12-27 LAB — ECHOCARDIOGRAM COMPLETE
HEIGHTINCHES: 63 in
WEIGHTICAEL: 2677.27 [oz_av]

## 2017-12-27 MED ORDER — ARIPIPRAZOLE 5 MG PO TABS: 5.0000 mg | ORAL_TABLET | Freq: Every day | ORAL | Status: DC

## 2017-12-27 MED ORDER — DULOXETINE HCL 60 MG PO CPEP
60.0000 mg | ORAL_CAPSULE | Freq: Every day | ORAL | Status: DC
  Administered 2017-12-27 – 2017-12-28 (×2): 60 mg via ORAL
  Filled 2017-12-27 (×2): qty 1

## 2017-12-27 MED ORDER — NITROGLYCERIN 0.4 MG SL SUBL: 0.4000 mg | SUBLINGUAL_TABLET | SUBLINGUAL | Status: DC | PRN

## 2017-12-27 MED ORDER — ZOLPIDEM TARTRATE 5 MG PO TABS: 5.0000 mg | ORAL_TABLET | Freq: Every evening | ORAL | Status: DC | PRN

## 2017-12-27 MED ORDER — PNEUMOCOCCAL VAC POLYVALENT 25 MCG/0.5ML IJ INJ: 0.5000 mL | INJECTION | INTRAMUSCULAR | Status: DC

## 2017-12-27 MED ORDER — REGADENOSON 0.4 MG/5ML IV SOLN
INTRAVENOUS | Status: AC
  Filled 2017-12-27: qty 5

## 2017-12-27 MED ORDER — TRAZODONE HCL 50 MG PO TABS
100.0000 mg | ORAL_TABLET | Freq: Every evening | ORAL | Status: DC | PRN
  Administered 2017-12-27: 100 mg via ORAL
  Filled 2017-12-27: qty 2

## 2017-12-27 MED ORDER — MORPHINE SULFATE (PF) 2 MG/ML IV SOLN
2.0000 mg | INTRAVENOUS | Status: DC | PRN
  Administered 2017-12-27 – 2017-12-28 (×5): 2 mg via INTRAVENOUS
  Filled 2017-12-27 (×6): qty 1

## 2017-12-27 MED ORDER — NICOTINE 21 MG/24HR TD PT24
21.0000 mg | MEDICATED_PATCH | Freq: Every day | TRANSDERMAL | Status: DC
  Administered 2017-12-27 – 2017-12-28 (×3): 21 mg via TRANSDERMAL
  Filled 2017-12-27 (×4): qty 1

## 2017-12-27 MED ORDER — ENOXAPARIN SODIUM 40 MG/0.4ML ~~LOC~~ SOLN
40.0000 mg | Freq: Every day | SUBCUTANEOUS | Status: DC
  Administered 2017-12-27 – 2017-12-28 (×2): 40 mg via SUBCUTANEOUS
  Filled 2017-12-27 (×2): qty 0.4

## 2017-12-27 MED ORDER — ACETAMINOPHEN 325 MG PO TABS
650.0000 mg | ORAL_TABLET | Freq: Four times a day (QID) | ORAL | Status: DC | PRN
  Administered 2017-12-27: 650 mg via ORAL
  Filled 2017-12-27: qty 2

## 2017-12-27 MED ORDER — ONDANSETRON HCL 4 MG/2ML IJ SOLN: 4.0000 mg | Freq: Four times a day (QID) | INTRAMUSCULAR | Status: DC | PRN

## 2017-12-27 MED ORDER — INFLUENZA VAC SPLIT QUAD 0.5 ML IM SUSY
0.5000 mL | PREFILLED_SYRINGE | INTRAMUSCULAR | Status: AC
  Administered 2017-12-28: 0.5 mL via INTRAMUSCULAR
  Filled 2017-12-27: qty 0.5

## 2017-12-27 MED ORDER — BUSPIRONE HCL 10 MG PO TABS
10.0000 mg | ORAL_TABLET | Freq: Three times a day (TID) | ORAL | Status: DC
Start: 2017-12-27 — End: 2017-12-28
  Administered 2017-12-27 – 2017-12-28 (×4): 10 mg via ORAL
  Filled 2017-12-27 (×4): qty 1

## 2017-12-27 MED ORDER — ENOXAPARIN SODIUM 40 MG/0.4ML ~~LOC~~ SOLN: 40.0000 mg | Freq: Every day | SUBCUTANEOUS | Status: DC

## 2017-12-27 MED ORDER — BUPRENORPHINE HCL 2 MG SL SUBL: 2.0000 mg | SUBLINGUAL_TABLET | Freq: Every day | SUBLINGUAL | Status: DC

## 2017-12-27 MED ORDER — ONDANSETRON HCL 4 MG PO TABS: 4.0000 mg | ORAL_TABLET | Freq: Four times a day (QID) | ORAL | Status: DC | PRN

## 2017-12-27 MED ORDER — TECHNETIUM TC 99M TETROFOSMIN IV KIT
30.0000 | PACK | Freq: Once | INTRAVENOUS | Status: AC | PRN
  Administered 2017-12-27: 30 via INTRAVENOUS

## 2017-12-27 MED ORDER — REGADENOSON 0.4 MG/5ML IV SOLN
0.4000 mg | Freq: Once | INTRAVENOUS | Status: AC
  Administered 2017-12-27: 0.4 mg via INTRAVENOUS
  Filled 2017-12-27: qty 5

## 2017-12-27 MED ORDER — GABAPENTIN 300 MG PO CAPS
300.0000 mg | ORAL_CAPSULE | Freq: Two times a day (BID) | ORAL | Status: DC
  Administered 2017-12-27 – 2017-12-28 (×3): 300 mg via ORAL
  Filled 2017-12-27 (×3): qty 1

## 2017-12-27 MED ORDER — OXYCODONE-ACETAMINOPHEN 5-325 MG PO TABS
1.0000 | ORAL_TABLET | Freq: Once | ORAL | Status: AC
  Administered 2017-12-27: 1 via ORAL
  Filled 2017-12-27: qty 1

## 2017-12-27 MED ORDER — OXYCODONE-ACETAMINOPHEN 5-325 MG PO TABS
1.0000 | ORAL_TABLET | ORAL | Status: DC | PRN
  Administered 2017-12-27 – 2017-12-28 (×6): 1 via ORAL
  Filled 2017-12-27 (×6): qty 1

## 2017-12-27 MED ORDER — TECHNETIUM TC 99M TETROFOSMIN IV KIT
10.0000 | PACK | Freq: Once | INTRAVENOUS | Status: AC | PRN
  Administered 2017-12-27: 10 via INTRAVENOUS

## 2017-12-27 MED ORDER — ARIPIPRAZOLE 5 MG PO TABS
5.0000 mg | ORAL_TABLET | Freq: Every day | ORAL | Status: DC
  Administered 2017-12-27: 5 mg via ORAL
  Filled 2017-12-27: qty 1

## 2017-12-27 MED ORDER — GABAPENTIN 300 MG PO CAPS: 300.0000 mg | ORAL_CAPSULE | Freq: Two times a day (BID) | ORAL | Status: DC

## 2017-12-27 MED ORDER — ASPIRIN 81 MG PO CHEW
324.0000 mg | CHEWABLE_TABLET | Freq: Every day | ORAL | Status: DC
  Administered 2017-12-27 – 2017-12-28 (×2): 324 mg via ORAL
  Filled 2017-12-27 (×2): qty 4

## 2017-12-27 MED ORDER — CLONAZEPAM 1 MG PO TABS
1.0000 mg | ORAL_TABLET | Freq: Every evening | ORAL | Status: DC | PRN
  Administered 2017-12-27 (×2): 1 mg via ORAL
  Filled 2017-12-27 (×2): qty 1

## 2017-12-27 MED ORDER — ACETAMINOPHEN 650 MG RE SUPP: 650.0000 mg | Freq: Four times a day (QID) | RECTAL | Status: DC | PRN

## 2017-12-27 NOTE — Progress Notes (Signed)
    Patient presented for Lexiscan nuclear stress test. Tolerated procedure well, although she was very anxious. Pending final stress imaging result.  Berton Bon, AGNP-C 12/27/2017  3:50 PM Pager: [phone]

## 2017-12-27 NOTE — Progress Notes (Signed)
  Echocardiogram 2D Echocardiogram has been performed.  Gerda Diss 12/27/2017, 9:24 AM

## 2017-12-27 NOTE — Progress Notes (Signed)
  PROGRESS NOTE   Patient admitted by Dr Clyde Lundborg this morning for evaluation of chest pain.  She has history of depression, anxiety, degenerative disc disease, chronic pain, tobacco use.  Cardiology team was consulted.  I saw the patient in the rounds this morning.  She denied any chest pain.  Currently she is eager to get her stress test or echocardiogram done.  Cardiology evaluation is pending at this time.  Vitals:   12/26/17 2130 12/27/17 0112  BP: 106/79 111/77  Pulse: 81   Resp: (!) 21   Temp:  97.8 F (36.6 C)  SpO2: 92%    Patient does not appear to be any acute distress, laying in the bed.  Her cardiac enzymes remain negative, LDL is 121, hemoglobin A1c 6.1.  Assessment and plan: Atypical chest pain Major depressive disorder Generalized anxiety disorder Chronic pain syndrome Tobacco use  - Cardiac enzymes are negative, LDL 121, hemoglobin A1c 6.1.  Currently await cardiology evaluation.  Echocardiogram performed but results are pending.  Rest of the care will be determined once related by cardiology.  Call with questions if needed  Ankit Joline Maxcy, MD [facility] Pager [phone]   If 7PM-7AM, please contact night-coverage [email or web address] Password [email or web address] 12/27/2017, 10:55 AM

## 2017-12-27 NOTE — H&P (Signed)
History and Physical    Brooke [name]:[id number] DOB: November 01, 1965 DOA: 12/26/2017  Referring MD/NP/PA:   PCP: Brooke Sharps, NP   Patient coming from:  The patient is coming from home.  At baseline, pt is independent for most of ADL.   Chief Complaint: chest pain  HPI: Brooke [name] is a 52 y.o. female with medical history significant of depression, anxiety, DDD, chronic pain syndrome, tobacco abuse, who presents with chest pain.  Patient states that she has been having intermittent chest pain for more than 2 weeks, which has worsened today.  Chest pain is located in the substernal area, 7-9 out of 10 severity, pressure-like, radiating to left arm.  No tenderness in calf areas.  It is associated with shortness of breath.  She has mild dry cough, but no fever or chills.  She has mild bilateral leg edema.  She states that she gained 15 pounds recently.  Patient was recently referred to cardiologist, Dr. Bing [name], who suspected patient may have CHF, and scheduled stress test and 2D echo, but not done yet.  Patient states that she has chronic pain, particularly in both legs.  She states that she used to take Suboxone, but recently switched to oxycodone by her PCP, Dr. Mordecai [name]. Patient does not have nausea, vomiting, diarrhea, abdominal pain, symptoms of UTI or unilateral weakness.  ED Course: pt was found to have negative troponin, BNP 21.7, negative d-dimer 0.45, electrolytes renal function okay, temperature normal, no tachycardia, RR 21, oxygen saturation 92-99% on room air, chest x-ray showed cardiomegaly and vascular congestion.  Patient is placed on telemetry bed for observation.  Review of Systems:   General: no fevers, chills, no body weight gain, has poor appetite, has fatigue HEENT: no blurry vision, hearing changes or sore throat Respiratory: has dyspnea, coughing, no wheezing CV: has chest pain, no palpitations GI: no nausea, vomiting, abdominal pain, diarrhea,  constipation GU: no dysuria, burning on urination, increased urinary frequency, hematuria  Ext: no leg edema Neuro: no unilateral weakness, numbness, or tingling, no vision change or hearing loss. Skin: no rash, no skin tear. MSK: No muscle spasm, no deformity, no limitation of range of movement in spin. Has chronic pain. Heme: No easy bruising.  Travel history: No recent long distant travel.  Allergy:  Allergies  Allergen Reactions  . Iohexol      Code: HIVES, Desc: dye allergy-throat closes up, SOB, chest pain, Onset Date: [id number]   . Sulfa Antibiotics     Past Medical History:  Diagnosis Date  . CHF (congestive heart failure) (HCC)   . DDD (degenerative disc disease), lumbar   . DDD (degenerative disc disease), lumbar   . Osteoarthritis     Past Surgical History:  Procedure Laterality Date  . BACK SURGERY    . knee replacement Left   . TOTAL KNEE ARTHROPLASTY      Social History:  reports that she has been smoking cigarettes. She has a 58.00 pack-year smoking history. She has never used smokeless tobacco. She reports that she drank alcohol. She reports that she does not use drugs.  Family History:  Family History  Problem Relation Age of Onset  . Heart attack Mother   . Breast cancer Mother      Prior to Admission medications   Medication Sig Start Date End Date Taking? Authorizing Provider  ARIPiprazole (ABILIFY) 5 MG tablet Take 1 tablet (5 mg total) by mouth at bedtime. For mood control 06/30/17   Azalia Bilis, MD  Buprenorphine HCl-Naloxone HCl 12-3 MG FILM DISSOLVE 1 FILM UNT D 11/24/17   [provider]  busPIRone (BUSPAR) 10 MG tablet Take 1 tablet (10 mg total) by mouth 3 (three) times daily. For mood control 06/30/17   Azalia Bilis, MD  clonazePAM (KLONOPIN) 0.5 MG tablet Take 1 tablet (0.5 mg total) by mouth at bedtime as needed (sleep). Patient taking differently: Take 1 mg by mouth at bedtime as needed (sleep).  06/11/17   Money, Gerlene Burdock, FNP   DULoxetine (CYMBALTA) 30 MG capsule Take 3 capsules (90 mg total) by mouth daily. For mood control 06/30/17   Azalia Bilis, MD  gabapentin (NEURONTIN) 300 MG capsule Take 300 mg by mouth 2 (two) times daily.    [provider]  traZODone (DESYREL) 100 MG tablet Take 1 tablet (100 mg total) by mouth at bedtime as needed for sleep. 06/30/17   Azalia Bilis, MD    Physical Exam: Vitals:   12/26/17 2030 12/26/17 2100 12/26/17 2130 12/27/17 0112  BP: 108/78 105/70 106/79 111/77  Pulse: 84 82 81   Resp: 13 15 (!) 21   Temp:    97.8 F (36.6 C)  TempSrc:    Oral  SpO2: 97% 95% 92%   Weight:    75.9 kg  Height:    5\' 3"  (1.6 m)   General: Not in acute distress HEENT:       Eyes: PERRL, EOMI, no scleral icterus.       ENT: No discharge from the ears and nose, no pharynx injection, no tonsillar enlargement.        Neck: No JVD, no bruit, no mass felt. Heme: No neck lymph node enlargement. Cardiac: S1/S2, RRR, No murmurs, No gallops or rubs. Respiratory:  No rales, wheezing, rhonchi or rubs. GI: Soft, nondistended, nontender, no rebound pain, no organomegaly, BS present. GU: No hematuria Ext: has trace leg edema bilaterally. 2+DP/PT pulse bilaterally. Musculoskeletal: No joint deformities, No joint redness or warmth, no limitation of ROM in spin. Skin: No rashes.  Neuro: Alert, oriented X3, cranial nerves II-XII grossly intact, moves all extremities normally.  Psych: Patient is not psychotic, no suicidal or hemocidal ideation.  Labs on Admission: I have personally reviewed following labs and imaging studies  CBC: Recent Labs  Lab 12/26/17 2013  WBC 12.0*  HGB 13.5  HCT 42.1  MCV 92.7  PLT 338   Basic Metabolic Panel: Recent Labs  Lab 12/26/17 2013  NA 139  K 4.1  CL 107  CO2 23  GLUCOSE 102*  BUN 8  CREATININE 0.71  CALCIUM 10.0   GFR: Estimated Creatinine Clearance: 81.2 mL/min (by C-G formula based on SCr of 0.71 mg/dL). Liver Function Tests: No results  for input(s): AST, ALT, ALKPHOS, BILITOT, PROT, ALBUMIN in the last 168 hours. No results for input(s): LIPASE, AMYLASE in the last 168 hours. No results for input(s): AMMONIA in the last 168 hours. Coagulation Profile: No results for input(s): INR, PROTIME in the last 168 hours. Cardiac Enzymes: Recent Labs  Lab 12/26/17 2013 12/27/17 0220  TROPONINI <0.03 <0.03   BNP (last 3 results) No results for input(s): PROBNP in the last 8760 hours. HbA1C: No results for input(s): HGBA1C in the last 72 hours. CBG: No results for input(s): GLUCAP in the last 168 hours. Lipid Profile: Recent Labs    12/27/17 0220  CHOL 210*  HDL 38*  LDLCALC 121*  TRIG 255*  CHOLHDL 5.5   Thyroid Function Tests: No results for input(s): TSH, T4TOTAL,  FREET4, T3FREE, THYROIDAB in the last 72 hours. Anemia Panel: No results for input(s): VITAMINB12, FOLATE, FERRITIN, TIBC, IRON, RETICCTPCT in the last 72 hours. Urine analysis:    Component Value Date/Time   COLORURINE YELLOW 05/30/2017 1752   APPEARANCEUR CLEAR 05/30/2017 1752   LABSPEC 1.006 05/30/2017 1752   PHURINE 6.0 05/30/2017 1752   GLUCOSEU NEGATIVE 05/30/2017 1752   HGBUR MODERATE (A) 05/30/2017 1752   BILIRUBINUR NEGATIVE 05/30/2017 1752   KETONESUR NEGATIVE 05/30/2017 1752   PROTEINUR NEGATIVE 05/30/2017 1752   UROBILINOGEN 0.2 10/10/2006 1839   NITRITE NEGATIVE 05/30/2017 1752   LEUKOCYTESUR TRACE (A) 05/30/2017 1752   Sepsis Labs: @LABRCNTIP (procalcitonin:4,lacticidven:4) ) Recent Results (from the past 240 hour(s))  MRSA PCR Screening     Status: Abnormal   Collection Time: 12/27/17  1:06 AM  Result Value Ref Range Status   MRSA by PCR POSITIVE (A) NEGATIVE Final    Comment:        The GeneXpert MRSA Assay (FDA approved for NASAL specimens only), is one component of a comprehensive MRSA colonization surveillance program. It is not intended to diagnose MRSA infection nor to guide or monitor treatment for MRSA  infections. RESULT CALLED TO, READ BACK BY AND VERIFIED WITHRubie Maid RN [id number] 12/27/17 A BROWNING Performed at [facility], [address] [place], [place] [address]      Radiological Exams on Admission: Dg Chest 2 View  Result Date: 12/26/2017 CLINICAL DATA:  Chest pain EXAM: CHEST - 2 VIEW COMPARISON:  12/16/2017 FINDINGS: Postsurgical changes at the cervicothoracic junction. Mild cardiomegaly with vascular congestion. Mild diffuse chronic interstitial opacity. No focal airspace disease or pneumothorax. IMPRESSION: Cardiomegaly with mild vascular congestion. Electronically Signed   By: Jasmine Pang M.D.   On: 12/26/2017 21:00     EKG: Independently reviewed.  Sinus rhythm, QTC 441, low voltage, RAD, mild T wave inversion V3-V6   Assessment/Plan Principal Problem:   Chest pain Active Problems:   MDD (major depressive disorder)   Generalized anxiety disorder   Chronic pain syndrome   Tobacco abuse   Leukocytosis   Chest pain: Patient has been having intermittent chest pain and shortness of breath for more than 2 weeks.  D-dimer negative, less likely to have PE.  Troponin negative.  EKG showed mild T wave inversion.  Patient was scheduled for outpatient stress test and 2D echo, but not done yet.   - will place on Tele bed for obs - cycle CE q6 x3 and repeat EKG in the am  - prn Nitroglycerin, Morphine, and aspirin - Risk factor stratification: will check FLP, UDS and A1C  - 2d echo - inpt card consult was requested via Epic  MDD (major depressive disorder) and Generalized anxiety disorder: -Continue Abilify, BuSpar, Cymbalta  Chronic pain syndrome: -PRN Percocet, Neurontin  Tobacco abuse: -nicotine patch  Leukocytosis: no fever or other signs of infection. Likely due to stress induced to demargination. CXR negative - get UA -follow up by CBC     DVT ppx: sQ Lovenox Code Status: Full code Family Communication: None at bed side.    Disposition Plan:   Anticipate discharge back to previous home environment Consults called:  none Admission status: Obs / tele     Date of Service 12/27/2017    Lorretta Harp [facility] Pager [phone]  If 7PM-7AM, please contact night-coverage [email or web address] Password [email or web address] 12/27/2017, 3:24 AM
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
## 2017-12-28 ENCOUNTER — Ambulatory Visit (HOSPITAL_BASED_OUTPATIENT_CLINIC_OR_DEPARTMENT_OTHER): Admission: RE | Admit: 2017-12-28 | Payer: Medicaid Other | Source: Ambulatory Visit

## 2017-12-28 DIAGNOSIS — Z720 Tobacco use: Secondary | ICD-10-CM | POA: Diagnosis not present

## 2017-12-28 DIAGNOSIS — F411 Generalized anxiety disorder: Secondary | ICD-10-CM | POA: Diagnosis not present

## 2017-12-28 DIAGNOSIS — R072 Precordial pain: Secondary | ICD-10-CM | POA: Diagnosis not present

## 2017-12-28 LAB — NM MYOCAR MULTI W/SPECT W/WALL MOTION / EF
CSEPPHR: 96 {beats}/min
Rest HR: 64 {beats}/min

## 2017-12-28 LAB — CBC
HEMATOCRIT: 38.7 % (ref 36.0–46.0)
Hemoglobin: 12.6 g/dL (ref 12.0–15.0)
MCH: 29.8 pg (ref 26.0–34.0)
MCHC: 32.6 g/dL (ref 30.0–36.0)
MCV: 91.5 fL (ref 80.0–100.0)
PLATELETS: 330 10*3/uL (ref 150–400)
RBC: 4.23 MIL/uL (ref 3.87–5.11)
RDW: 13.2 % (ref 11.5–15.5)
WBC: 13.9 10*3/uL — ABNORMAL HIGH (ref 4.0–10.5)
nRBC: 0 % (ref 0.0–0.2)

## 2017-12-28 MED ORDER — FLUTICASONE PROPIONATE 50 MCG/ACT NA SUSP: 1.0000 | Freq: Every day | NASAL | 0 refills | Status: DC

## 2017-12-28 MED ORDER — ATORVASTATIN CALCIUM 40 MG PO TABS: 40.0000 mg | ORAL_TABLET | Freq: Every day | ORAL | 0 refills | Status: DC

## 2017-12-28 MED ORDER — FLUTICASONE PROPIONATE 50 MCG/ACT NA SUSP
1.0000 | Freq: Every day | NASAL | Status: DC
  Administered 2017-12-28: 1 via NASAL
  Filled 2017-12-28: qty 16

## 2017-12-28 MED ORDER — ATORVASTATIN CALCIUM 40 MG PO TABS: 40.0000 mg | ORAL_TABLET | Freq: Every day | ORAL | Status: DC

## 2017-12-28 MED ORDER — GUAIFENESIN ER 600 MG PO TB12: 600.0000 mg | ORAL_TABLET | Freq: Two times a day (BID) | ORAL | Status: DC | PRN

## 2017-12-28 NOTE — Progress Notes (Signed)
Pt given discharge instructions and verbalized understanding of follow up appointment and new medication regimen.

## 2017-12-28 NOTE — Progress Notes (Signed)
Provided patient with [facility] letter

## 2017-12-28 NOTE — Progress Notes (Signed)
Progress Note  Patient Name: Brooke [name] Date of Encounter: 12/28/2017  Primary Cardiologist: Gypsy Balsam, MD    Subjective   52 year old female with a history of depression, anxiety, chronic pain syndrome, tobacco abuse who was admitted to the hospital for further evaluation of chest pain shortness breath.  She was seen in the med [facility] emergency room.  She was thought to have congestive heart failure.  She was admitted to [facility].  Cardiogram shows normal left ventricular systolic function.  Lexiscan Myoview study shows no evidence of ischemia and shows normal left ventricular systolic function.  Ejection fraction 61%.  She is quite upset by the fact that she was told she had heart failure and the confirmatory studies (  echo and Myoview study) show no evidence of heart failure.  Inpatient Medications    Scheduled Meds: . ARIPiprazole  5 mg Oral QHS  . aspirin  324 mg Oral Daily  . atorvastatin  40 mg Oral q1800  . busPIRone  10 mg Oral TID  . DULoxetine  60 mg Oral Daily  . enoxaparin (LOVENOX) injection  40 mg Subcutaneous Daily  . gabapentin  300 mg Oral BID  . Influenza vac split quadrivalent PF  0.5 mL Intramuscular Tomorrow-1000  . nicotine  21 mg Transdermal Daily  . pneumococcal 23 valent vaccine  0.5 mL Intramuscular Tomorrow-1000   Continuous Infusions:  PRN Meds: acetaminophen **OR** acetaminophen, clonazePAM, nitroGLYCERIN, ondansetron **OR** ondansetron (ZOFRAN) IV, oxyCODONE-acetaminophen, traZODone, zolpidem   Vital Signs    Vitals:   12/27/17 1933 12/27/17 2340 12/28/17 0344 12/28/17 0800  BP: 104/63 104/62 (!) 95/55 (!) 86/64  Pulse: 68 78 63 67  Resp: 12 11 16 11   Temp: 97.9 F (36.6 C) 97.9 F (36.6 C) 98 F (36.7 C) 98 F (36.7 C)  TempSrc: Oral Oral Oral Oral  SpO2: 98% 99% 99%   Weight:      Height:        Intake/Output Summary (Last 24 hours) at 12/28/2017 0946 Last data filed at 12/27/2017 2000 Gross per 24  hour  Intake 240 ml  Output -  Net 240 ml   Filed Weights   12/27/17 0112  Weight: 75.9 kg    Telemetry    NSR  - Personally Reviewed  ECG     NSR  - Personally Reviewed  Physical Exam    GEN:  Middle-aged female, no acute distress Neck: No JVD Cardiac: RRR, no murmurs, rubs, or gallops.  Respiratory: Clear to auscultation bilaterally. GI: Soft, nontender, non-distended  MS: No edema; No deformity. Neuro:  Nonfocal  Psych: Normal affect   Labs    Chemistry Recent Labs  Lab 12/26/17 2013 12/27/17 0746  NA 139 139  K 4.1 3.9  CL 107 104  CO2 23 22  GLUCOSE 102* 87  BUN 8 7  CREATININE 0.71 0.84  CALCIUM 10.0 9.5  GFRNONAA >60 >60  GFRAA >60 >60  ANIONGAP 9 13     Hematology Recent Labs  Lab 12/26/17 2013 12/27/17 0746 12/28/17 0249  WBC 12.0* 12.3* 13.9*  RBC 4.54 4.74 4.23  HGB 13.5 13.8 12.6  HCT 42.1 43.9 38.7  MCV 92.7 92.6 91.5  MCH 29.7 29.1 29.8  MCHC 32.1 31.4 32.6  RDW 13.2 13.2 13.2  PLT 338 357 330    Cardiac Enzymes Recent Labs  Lab 12/26/17 2013 12/27/17 0220 12/27/17 0746 12/27/17 1348  TROPONINI <0.03 <0.03 <0.03 <0.03   No results for input(s): TROPIPOC in  the last 168 hours.   BNP Recent Labs  Lab 12/26/17 2013  BNP 21.7     DDimer  Recent Labs  Lab 12/26/17 2013  DDIMER 0.45     Radiology    Dg Chest 2 View  Result Date: 12/26/2017 CLINICAL DATA:  Chest pain EXAM: CHEST - 2 VIEW COMPARISON:  12/16/2017 FINDINGS: Postsurgical changes at the cervicothoracic junction. Mild cardiomegaly with vascular congestion. Mild diffuse chronic interstitial opacity. No focal airspace disease or pneumothorax. IMPRESSION: Cardiomegaly with mild vascular congestion. Electronically Signed   By: Jasmine Pang M.D.   On: 12/26/2017 21:00   Nm Myocar Multi W/spect W/wall Motion / Ef  Result Date: 12/28/2017 CLINICAL DATA:  52 year old female with a history of chest pain. EXAM: MYOCARDIAL IMAGING WITH SPECT (REST AND  PHARMACOLOGIC-STRESS) GATED LEFT VENTRICULAR WALL MOTION STUDY LEFT VENTRICULAR EJECTION FRACTION TECHNIQUE: Standard myocardial SPECT imaging was performed after resting intravenous injection of 10 mCi Tc-52m tetrofosmin. Subsequently, intravenous infusion of Lexiscan was performed under the supervision of the Cardiology staff. At peak effect of the drug, 30 mCi Tc-24m tetrofosmin was injected intravenously and standard myocardial SPECT imaging was performed. Quantitative gated imaging was also performed to evaluate left ventricular wall motion, and estimate left ventricular ejection fraction. COMPARISON:  None. FINDINGS: Perfusion: No decreased activity in the left ventricle on stress imaging to suggest reversible ischemia or infarction. Wall Motion: Normal left ventricular wall motion. No left ventricular dilation. Left Ventricular Ejection Fraction: 61 % End diastolic volume 85 ml End systolic volume 33 ml IMPRESSION: 1. No reversible ischemia or infarction. 2. Normal left ventricular wall motion. 3. Left ventricular ejection fraction 61% 4. Non invasive risk stratification*: Low *2012 Appropriate Use Criteria for Coronary Revascularization Focused Update: J Am Coll Cardiol. 2012;59(9):857-881. http://content.dementiazones.com.aspx?articleid=[email or web address] Electronically Signed   By: Gilmer Mor D.O.   On: 12/28/2017 08:16    Cardiac Studies     Patient Profile     52 y.o. female   Assessment & Plan    1.  Chest pain: The patient had a Myoview study yesterday which showed no evidence of ischemia.  She has normal left ventricular systolic function.  The echocardiogram also showed normal left ventricular systolic function and normal diastolic function. He had no significant valvular abnormality's.  She may be discharged from hospital today.  She will follow-up with  Gypsy Balsam, MD    2.  Hyperlipidemia: She has a triglyceride level of 255.  Her LDL is 122.  She has a family history of  coronary disease.  I started her on atorvastatin 40 mg a day.  She will need to follow-up with her primary medical doctor or  Gypsy Balsam, MD   in 3 months for follow-up lipid levels and liver enzymes.  [facility] will sign off.   Medication Recommendations:  Started Atorvastatin  Other recommendations (labs, testing, etc):   Follow up as an outpatient:   With her primary MD.  She will need Lipid profile and liver enz in 3 months   For questions or updates, please contact [facility] Please consult [email or web address] for contact info under        Signed, Kristeen Miss, MD  12/28/2017, 9:46 AM

## 2017-12-28 NOTE — Discharge Summary (Signed)
Physician Discharge Summary  [name] [facility] ZOX:[id number] DOB: 07-Sep-1965 DOA: 12/26/2017  PCP: Elinor Dodge, MD  Admit date: 12/26/2017 Discharge date: 12/28/2017  Admitted From: home Discharge disposition: home   Recommendations for Outpatient Follow-Up:   1. FLP/LFTs in 6 weeks   Discharge Diagnosis:   Principal Problem:   Chest pain Active Problems:   MDD (major depressive disorder)   Generalized anxiety disorder   Chronic pain syndrome   Tobacco abuse   Leukocytosis    Discharge Condition: Improved.  Diet recommendation: Low sodium, heart healthy.  Wound care: None.  Code status: Full.   History of Present Illness:   Brooke [name] is a 52 y.o. female with medical history significant of depression, anxiety, DDD, chronic pain syndrome, tobacco abuse, who presents with chest pain.  Patient states that she has been having intermittent chest pain for more than 2 weeks, which has worsened today.  Chest pain is located in the substernal area, 7-9 out of 10 severity, pressure-like, radiating to left arm.  No tenderness in calf areas.  It is associated with shortness of breath.  She has mild dry cough, but no fever or chills.  She has mild bilateral leg edema.  She states that she gained 15 pounds recently.  Patient was recently referred to cardiologist, Dr. Bing Matter, who suspected patient may have CHF, and scheduled stress test and 2D echo, but not done yet.  Patient states that she has chronic pain, particularly in both legs.  She states that she used to take Suboxone, but recently switched to oxycodone by her PCP, Dr. Mordecai Maes. Patient does not have nausea, vomiting, diarrhea, abdominal pain, symptoms of UTI or unilateral weakness.   Hospital Course by Problem:   1.  Chest pain: The patient had a Myoview study yesterday which showed no evidence of ischemia.  She has normal left ventricular systolic function.  The echocardiogram also showed normal  left ventricular systolic function and normal diastolic function.   2.  Hyperlipidemia: She has a triglyceride level of 255.  Her LDL is 122.  She has a family history of coronary disease.   - atorvastatin 40 mg a day.   -She will need to follow-up with her primary medical doctor or  Gypsy Balsam, MD   in 3 months for follow-up lipid levels and liver enzymes.  3. Anxiety -resume home meds -outpatient follow up  Medical Consultants:   cards   Discharge Exam:   Vitals:   12/28/17 0344 12/28/17 0800  BP: (!) 95/55 (!) 86/64  Pulse: 63 67  Resp: 16 11  Temp: 98 F (36.7 C) 98 F (36.7 C)  SpO2: 99%    Vitals:   12/27/17 1933 12/27/17 2340 12/28/17 0344 12/28/17 0800  BP: 104/63 104/62 (!) 95/55 (!) 86/64  Pulse: 68 78 63 67  Resp: 12 11 16 11   Temp: 97.9 F (36.6 C) 97.9 F (36.6 C) 98 F (36.7 C) 98 F (36.7 C)  TempSrc: Oral Oral Oral Oral  SpO2: 98% 99% 99%   Weight:      Height:          The results of significant diagnostics from this hospitalization (including imaging, microbiology, ancillary and laboratory) are listed below for reference.     Procedures and Diagnostic Studies:   Dg Chest 2 View  Result Date: 12/26/2017 CLINICAL DATA:  Chest pain EXAM: CHEST - 2 VIEW COMPARISON:  12/16/2017 FINDINGS: Postsurgical changes at the cervicothoracic junction. Mild cardiomegaly with  vascular congestion. Mild diffuse chronic interstitial opacity. No focal airspace disease or pneumothorax. IMPRESSION: Cardiomegaly with mild vascular congestion. Electronically Signed   By: Jasmine Pang M.D.   On: 12/26/2017 21:00   Nm Myocar Multi W/spect W/wall Motion / Ef  Result Date: 12/28/2017 CLINICAL DATA:  52 year old female with a history of chest pain. EXAM: MYOCARDIAL IMAGING WITH SPECT (REST AND PHARMACOLOGIC-STRESS) GATED LEFT VENTRICULAR WALL MOTION STUDY LEFT VENTRICULAR EJECTION FRACTION TECHNIQUE: Standard myocardial SPECT imaging was performed after resting  intravenous injection of 10 mCi Tc-23m tetrofosmin. Subsequently, intravenous infusion of Lexiscan was performed under the supervision of the Cardiology staff. At peak effect of the drug, 30 mCi Tc-63m tetrofosmin was injected intravenously and standard myocardial SPECT imaging was performed. Quantitative gated imaging was also performed to evaluate left ventricular wall motion, and estimate left ventricular ejection fraction. COMPARISON:  None. FINDINGS: Perfusion: No decreased activity in the left ventricle on stress imaging to suggest reversible ischemia or infarction. Wall Motion: Normal left ventricular wall motion. No left ventricular dilation. Left Ventricular Ejection Fraction: 61 % End diastolic volume 85 ml End systolic volume 33 ml IMPRESSION: 1. No reversible ischemia or infarction. 2. Normal left ventricular wall motion. 3. Left ventricular ejection fraction 61% 4. Non invasive risk stratification*: Low *2012 Appropriate Use Criteria for Coronary Revascularization Focused Update: J Am Coll Cardiol. 2012;59(9):857-881. http://content.dementiazones.com.aspx?articleid=[email or web address] Electronically Signed   By: Gilmer Mor D.O.   On: 12/28/2017 08:16     Labs:   Basic Metabolic Panel: Recent Labs  Lab 12/26/17 2013 12/27/17 0746  NA 139 139  K 4.1 3.9  CL 107 104  CO2 23 22  GLUCOSE 102* 87  BUN 8 7  CREATININE 0.71 0.84  CALCIUM 10.0 9.5   GFR Estimated Creatinine Clearance: 77.3 mL/min (by C-G formula based on SCr of 0.84 mg/dL). Liver Function Tests: No results for input(s): AST, ALT, ALKPHOS, BILITOT, PROT, ALBUMIN in the last 168 hours. No results for input(s): LIPASE, AMYLASE in the last 168 hours. No results for input(s): AMMONIA in the last 168 hours. Coagulation profile No results for input(s): INR, PROTIME in the last 168 hours.  CBC: Recent Labs  Lab 12/26/17 2013 12/27/17 0746 12/28/17 0249  WBC 12.0* 12.3* 13.9*  HGB 13.5 13.8 12.6  HCT 42.1 43.9 38.7  MCV  92.7 92.6 91.5  PLT 338 357 330   Cardiac Enzymes: Recent Labs  Lab 12/26/17 2013 12/27/17 0220 12/27/17 0746 12/27/17 1348  TROPONINI <0.03 <0.03 <0.03 <0.03   BNP: Invalid input(s): POCBNP CBG: No results for input(s): GLUCAP in the last 168 hours. D-Dimer Recent Labs    12/26/17 2013  DDIMER 0.45   Hgb A1c Recent Labs    12/27/17 0220  HGBA1C 6.1*   Lipid Profile Recent Labs    12/27/17 0220  CHOL 210*  HDL 38*  LDLCALC 121*  TRIG 255*  CHOLHDL 5.5   Thyroid function studies No results for input(s): TSH, T4TOTAL, T3FREE, THYROIDAB in the last 72 hours.  Invalid input(s): FREET3 Anemia work up No results for input(s): VITAMINB12, FOLATE, FERRITIN, TIBC, IRON, RETICCTPCT in the last 72 hours. Microbiology Recent Results (from the past 240 hour(s))  MRSA PCR Screening     Status: Abnormal   Collection Time: 12/27/17  1:06 AM  Result Value Ref Range Status   MRSA by PCR POSITIVE (A) NEGATIVE Final    Comment:        The GeneXpert MRSA Assay (FDA approved for NASAL specimens only), is one component of  a comprehensive MRSA colonization surveillance program. It is not intended to diagnose MRSA infection nor to guide or monitor treatment for MRSA infections. RESULT CALLED TO, READ BACK BY AND VERIFIED WITHRubie Maid RN [id number] 12/27/17 A BROWNING Performed at [facility], [address] [place], [place] [address]      Discharge Instructions:   Discharge Instructions    Diet - low sodium heart healthy   Complete by:  As directed    Discharge instructions   Complete by:  As directed    Please follow up in 6 weeks with PCP for lipid studies as well as LFTs   Increase activity slowly   Complete by:  As directed      Allergies as of 12/28/2017      Reactions   Iohexol     Code: HIVES, Desc: dye allergy-throat closes up, SOB, chest pain, Onset Date: [id number]   Sulfa Antibiotics       Medication List    TAKE these medications     ARIPiprazole 5 MG tablet Commonly known as:  ABILIFY Take 1 tablet (5 mg total) by mouth at bedtime. For mood control   atorvastatin 40 MG tablet Commonly known as:  LIPITOR Take 1 tablet (40 mg total) by mouth daily at 6 PM.   busPIRone 10 MG tablet Commonly known as:  BUSPAR Take 1 tablet (10 mg total) by mouth 3 (three) times daily. For mood control   clonazePAM 0.5 MG tablet Commonly known as:  KLONOPIN Take 1 tablet (0.5 mg total) by mouth at bedtime as needed (sleep). What changed:  how much to take   DULoxetine 30 MG capsule Commonly known as:  CYMBALTA Take 3 capsules (90 mg total) by mouth daily. For mood control   fluticasone 50 MCG/ACT nasal spray Commonly known as:  FLONASE Place 1 spray into both nostrils daily.   gabapentin 300 MG capsule Commonly known as:  NEURONTIN Take 300 mg by mouth 2 (two) times daily.   guaiFENesin 100 MG/5ML liquid Commonly known as:  ROBITUSSIN Take 200 mg by mouth 3 (three) times daily as needed for cough.   oxyCODONE-acetaminophen 10-325 MG tablet Commonly known as:  PERCOCET Take 1 tablet by mouth 4 (four) times daily as needed for pain.   traZODone 100 MG tablet Commonly known as:  DESYREL Take 1 tablet (100 mg total) by mouth at bedtime as needed for sleep.      Follow-up Information    Sanchez-Brugal, Rockey Situ, MD Follow up in 1 week(s).   Specialty:  Internal Medicine Contact information: [address] [place] [address] [phone]        Georgeanna Lea, MD .   Specialty:  Cardiology Contact information: [address] [place] [address] [phone]            Time coordinating discharge: 25 min  Signed:  Joseph Art  [facility] 12/28/2017, 10:18 AM

## 2017-12-31 ENCOUNTER — Ambulatory Visit (HOSPITAL_BASED_OUTPATIENT_CLINIC_OR_DEPARTMENT_OTHER): Admission: RE | Admit: 2017-12-31 | Payer: Medicaid Other | Source: Ambulatory Visit

## 2018-01-18 ENCOUNTER — Encounter: Payer: Self-pay | Admitting: Cardiology

## 2018-01-18 ENCOUNTER — Ambulatory Visit (INDEPENDENT_AMBULATORY_CARE_PROVIDER_SITE_OTHER): Payer: Self-pay | Admitting: Cardiology

## 2018-01-18 VITALS — BP 122/70 | HR 81 | Ht 62.0 in | Wt 169.4 lb

## 2018-01-18 DIAGNOSIS — R0789 Other chest pain: Secondary | ICD-10-CM

## 2018-01-18 DIAGNOSIS — G894 Chronic pain syndrome: Secondary | ICD-10-CM

## 2018-01-18 DIAGNOSIS — F411 Generalized anxiety disorder: Secondary | ICD-10-CM

## 2018-01-18 NOTE — Patient Instructions (Signed)
Medication Instructions:  Your physician recommends that you continue on your current medications as directed. Please refer to the Current Medication list given to you today.  If you need a refill on your cardiac medications before your next appointment, please call your pharmacy.   Lab work: None ordered If you have labs (blood work) drawn today and your tests are completely normal, you will receive your results only by: Marland Kitchen MyChart Message (if you have MyChart) OR . A paper copy in the mail If you have any lab test that is abnormal or we need to change your treatment, we will call you to review the results.  Testing/Procedures: None ordered  Follow-Up: At [facility], you and your health needs are our priority.  As part of our continuing mission to provide you with exceptional heart care, we have created designated Provider Care Teams.  These Care Teams include your primary Cardiologist (physician) and Advanced Practice Providers (APPs -  Physician Assistants and Nurse Practitioners) who all work together to provide you with the care you need, when you need it. You will need a follow up appointment in 6 months.  Please call our office 2 months in advance to schedule this appointment.  You may see Gypsy Balsam, MD or another member of our [facility] Provider Team in [place]: Norman Herrlich, MD . Belva Crome, MD  Any Other Special Instructions Will Be Listed Below (If Applicable).

## 2018-01-18 NOTE — Progress Notes (Signed)
Cardiology Office Note:    Date:  01/18/2018   ID:  Brooke [name], DOB 08/23/1965, MRN [id number]  PCP:  Elinor Dodge, MD  Cardiologist:  Gypsy Balsam, MD    Referring MD: Lavinia Sharps, NP   Chief Complaint  Patient presents with  . Follow-up  Still having some chest pain  History of Present Illness:    Brooke [name] is a 52 y.o. female with risk factors for coronary artery disease and very atypical chest pain.  Recently she ended up going to the hospital stress test was done form of nuclear stress test which was normal echocardiogram was done which was normal as well we had a long discussion today about what to do with the situation I think this is anxiety as well as some depression that she is having she ran out of medication for her depression and desperately need to get some new one from her primary care physician she is planning to get this medications on Monday she felt much better when she was taking those medications.  In the meantime we talked about relaxation technique with talk about optimistic thinking.  I told her that she is doing well from heart point of view.  The key will be also modification of her risk factors for coronary artery disease she smokes however I do not think this is a good time about potentially quitting smoking I think her depression and anxiety need to be better control before attempting quitting smoking I gave her some few he needs help to quit smoking she understand that she will try to do it later.  Past Medical History:  Diagnosis Date  . CHF (congestive heart failure) (HCC)   . DDD (degenerative disc disease), lumbar   . DDD (degenerative disc disease), lumbar   . Osteoarthritis     Past Surgical History:  Procedure Laterality Date  . BACK SURGERY    . knee replacement Left   . TOTAL KNEE ARTHROPLASTY      Current Medications: Current Meds  Medication Sig  . atorvastatin (LIPITOR) 40 MG tablet Take 1 tablet (40 mg  total) by mouth daily at 6 PM.  . busPIRone (BUSPAR) 10 MG tablet Take 1 tablet (10 mg total) by mouth 3 (three) times daily. For mood control  . DULoxetine (CYMBALTA) 30 MG capsule Take 3 capsules (90 mg total) by mouth daily. For mood control  . fluticasone (FLONASE) 50 MCG/ACT nasal spray Place 1 spray into both nostrils daily.  Marland Kitchen gabapentin (NEURONTIN) 300 MG capsule Take 300 mg by mouth 2 (two) times daily.  Marland Kitchen guaiFENesin (ROBITUSSIN) 100 MG/5ML liquid Take 200 mg by mouth 3 (three) times daily as needed for cough.  Marland Kitchen oxyCODONE (ROXICODONE) 15 MG immediate release tablet Take 15 mg by mouth 3 (three) times daily as needed for pain.  . traZODone (DESYREL) 100 MG tablet Take 1 tablet (100 mg total) by mouth at bedtime as needed for sleep.     Allergies:   Iohexol and Sulfa antibiotics   Social History   Socioeconomic History  . Marital status: Single    Spouse name: Not on file  . Number of children: Not on file  . Years of education: Not on file  . Highest education level: Not on file  Occupational History  . Not on file  Social Needs  . Financial resource strain: Not on file  . Food insecurity:    Worry: Not on file    Inability: Not on file  .  Transportation needs:    Medical: Not on file    Non-medical: Not on file  Tobacco Use  . Smoking status: Current Every Day Smoker    Packs/day: 2.00    Years: 29.00    Pack years: 58.00    Types: Cigarettes  . Smokeless tobacco: Never Used  Substance and Sexual Activity  . Alcohol use: Not Currently  . Drug use: Never  . Sexual activity: Not on file  Lifestyle  . Physical activity:    Days per week: Not on file    Minutes per session: Not on file  . Stress: Not on file  Relationships  . Social connections:    Talks on phone: Not on file    Gets together: Not on file    Attends religious service: Not on file    Active member of club or organization: Not on file    Attends meetings of clubs or organizations: Not on file      Relationship status: Not on file  Other Topics Concern  . Not on file  Social History Narrative  . Not on file     Family History: The patient's family history includes Breast cancer in her mother; Heart attack in her mother. ROS:   Please see the history of present illness.    All 14 point review of systems negative except as described per history of present illness  EKGs/Labs/Other Studies Reviewed:      Recent Labs: 05/31/2017: Magnesium 2.0 12/16/2017: ALT 27 12/26/2017: B Natriuretic Peptide 21.7 12/27/2017: BUN 7; Creatinine, Ser 0.84; Potassium 3.9; Sodium 139 12/28/2017: Hemoglobin 12.6; Platelets 330  Recent Lipid Panel    Component Value Date/Time   CHOL 210 (H) 12/27/2017 0220   TRIG 255 (H) 12/27/2017 0220   HDL 38 (L) 12/27/2017 0220   CHOLHDL 5.5 12/27/2017 0220   VLDL 51 (H) 12/27/2017 0220   LDLCALC 121 (H) 12/27/2017 0220    Physical Exam:    VS:  BP 122/70   Pulse 81   Ht 5\' 2"  (1.575 m)   Wt 169 lb 6.4 oz (76.8 kg)   SpO2 97%   BMI 30.98 kg/m     Wt Readings from Last 3 Encounters:  01/18/18 169 lb 6.4 oz (76.8 kg)  12/27/17 167 lb 5.3 oz (75.9 kg)  12/18/17 169 lb 1.9 oz (76.7 kg)     GEN:  Well nourished, well developed in no acute distress HEENT: Normal NECK: No JVD; No carotid bruits LYMPHATICS: No lymphadenopathy CARDIAC: RRR, no murmurs, no rubs, no gallops RESPIRATORY:  Clear to auscultation without rales, wheezing or rhonchi  ABDOMEN: Soft, non-tender, non-distended MUSCULOSKELETAL:  No edema; No deformity  SKIN: Warm and dry LOWER EXTREMITIES: no swelling NEUROLOGIC:  Alert and oriented x 3 PSYCHIATRIC:  Normal affect   ASSESSMENT:    1. Atypical chest pain   2. Chronic pain syndrome   3. Generalized anxiety disorder    PLAN:    In order of problems listed above:  1. Atypical chest pain all testing negative continue risk factors modifications including Lipitor 2. Chronic pain syndrome followed by internal  medicine 3. General anxiety disorder can benefit from psychiatry and psychotherapy.  Awaiting Medicaid.   Medication Adjustments/Labs and Tests Ordered: Current medicines are reviewed at length with the patient today.  Concerns regarding medicines are outlined above.  No orders of the defined types were placed in this encounter.  Medication changes: No orders of the defined types were placed in this encounter.  Signed, Georgeanna Lea, MD, [facility] 01/18/2018 4:09 PM    [facility]

## 2018-02-05 ENCOUNTER — Encounter (HOSPITAL_COMMUNITY): Payer: Self-pay

## 2018-02-05 ENCOUNTER — Emergency Department (HOSPITAL_COMMUNITY)
Admission: EM | Admit: 2018-02-05 | Discharge: 2018-02-06 | Disposition: A | Discharge status: Other Acute Inpatient Hospital | Payer: MEDICAID | Attending: Emergency Medicine | Admitting: Emergency Medicine

## 2018-02-05 ENCOUNTER — Emergency Department (HOSPITAL_COMMUNITY): Payer: MEDICAID

## 2018-02-05 DIAGNOSIS — I509 Heart failure, unspecified: Secondary | ICD-10-CM | POA: Insufficient documentation

## 2018-02-05 DIAGNOSIS — F332 Major depressive disorder, recurrent severe without psychotic features: Secondary | ICD-10-CM | POA: Diagnosis not present

## 2018-02-05 DIAGNOSIS — R110 Nausea: Secondary | ICD-10-CM | POA: Insufficient documentation

## 2018-02-05 DIAGNOSIS — F32A Depression, unspecified: Secondary | ICD-10-CM

## 2018-02-05 DIAGNOSIS — R0602 Shortness of breath: Secondary | ICD-10-CM | POA: Diagnosis not present

## 2018-02-05 DIAGNOSIS — R0789 Other chest pain: Secondary | ICD-10-CM | POA: Diagnosis not present

## 2018-02-05 DIAGNOSIS — F17210 Nicotine dependence, cigarettes, uncomplicated: Secondary | ICD-10-CM | POA: Insufficient documentation

## 2018-02-05 DIAGNOSIS — Z79899 Other long term (current) drug therapy: Secondary | ICD-10-CM | POA: Diagnosis not present

## 2018-02-05 DIAGNOSIS — R079 Chest pain, unspecified: Secondary | ICD-10-CM

## 2018-02-05 DIAGNOSIS — F329 Major depressive disorder, single episode, unspecified: Secondary | ICD-10-CM | POA: Insufficient documentation

## 2018-02-05 DIAGNOSIS — R45851 Suicidal ideations: Secondary | ICD-10-CM | POA: Insufficient documentation

## 2018-02-05 LAB — CBC
HCT: 42.4 % (ref 36.0–46.0)
HEMOGLOBIN: 13.4 g/dL (ref 12.0–15.0)
MCH: 29.3 pg (ref 26.0–34.0)
MCHC: 31.6 g/dL (ref 30.0–36.0)
MCV: 92.6 fL (ref 80.0–100.0)
Platelets: 334 10*3/uL (ref 150–400)
RBC: 4.58 MIL/uL (ref 3.87–5.11)
RDW: 13.4 % (ref 11.5–15.5)
WBC: 14 10*3/uL — ABNORMAL HIGH (ref 4.0–10.5)
nRBC: 0 % (ref 0.0–0.2)

## 2018-02-05 LAB — RAPID URINE DRUG SCREEN, HOSP PERFORMED
AMPHETAMINES: NOT DETECTED
Barbiturates: NOT DETECTED
Benzodiazepines: NOT DETECTED
Cocaine: NOT DETECTED
Opiates: NOT DETECTED
TETRAHYDROCANNABINOL: NOT DETECTED

## 2018-02-05 LAB — COMPREHENSIVE METABOLIC PANEL
ALBUMIN: 4.6 g/dL (ref 3.5–5.0)
ALK PHOS: 146 U/L — AB (ref 38–126)
ALT: 25 U/L (ref 0–44)
ANION GAP: 11 (ref 5–15)
AST: 23 U/L (ref 15–41)
BILIRUBIN TOTAL: 0.4 mg/dL (ref 0.3–1.2)
BUN: 8 mg/dL (ref 6–20)
CALCIUM: 9.7 mg/dL (ref 8.9–10.3)
CO2: 24 mmol/L (ref 22–32)
CREATININE: 0.64 mg/dL (ref 0.44–1.00)
Chloride: 103 mmol/L (ref 98–111)
GFR calc non Af Amer: >60 mL/min (ref >60)
GLUCOSE: 84 mg/dL (ref 70–99)
Potassium: 4 mmol/L (ref 3.5–5.1)
SODIUM: 138 mmol/L (ref 135–145)
TOTAL PROTEIN: 8.3 g/dL — AB (ref 6.5–8.1)

## 2018-02-05 LAB — I-STAT TROPONIN, ED
TROPONIN I, POC: 0.06 ng/mL (ref 0.00–0.08)
Troponin i, poc: 0 ng/mL (ref 0.00–0.08)

## 2018-02-05 LAB — ACETAMINOPHEN LEVEL: Acetaminophen (Tylenol), Serum: <10 ug/mL — ABNORMAL LOW (ref 10–30)

## 2018-02-05 LAB — URINALYSIS, ROUTINE W REFLEX MICROSCOPIC
Bilirubin Urine: NEGATIVE
Glucose, UA: NEGATIVE mg/dL
Ketones, ur: NEGATIVE mg/dL
Nitrite: NEGATIVE
PROTEIN: NEGATIVE mg/dL
SPECIFIC GRAVITY, URINE: 1.003 — AB (ref 1.005–1.030)
pH: 6 (ref 5.0–8.0)

## 2018-02-05 LAB — I-STAT BETA HCG BLOOD, ED (MC, WL, AP ONLY): HCG, QUANTITATIVE: 12.5 m[IU]/mL — AB (ref <5)

## 2018-02-05 LAB — ETHANOL: Alcohol, Ethyl (B): <10 mg/dL (ref <10)

## 2018-02-05 LAB — SALICYLATE LEVEL: Salicylate Lvl: <7 mg/dL (ref 2.8–30.0)

## 2018-02-05 LAB — BRAIN NATRIURETIC PEPTIDE: B Natriuretic Peptide: 16 pg/mL (ref 0.0–100.0)

## 2018-02-05 MED ORDER — BUSPIRONE HCL 10 MG PO TABS
10.0000 mg | ORAL_TABLET | Freq: Three times a day (TID) | ORAL | Status: DC
  Administered 2018-02-05 – 2018-02-06 (×2): 10 mg via ORAL
  Filled 2018-02-05 (×2): qty 1

## 2018-02-05 MED ORDER — ONDANSETRON 4 MG PO TBDP
4.0000 mg | ORAL_TABLET | Freq: Once | ORAL | Status: AC
  Administered 2018-02-05: 4 mg via ORAL
  Filled 2018-02-05: qty 1

## 2018-02-05 MED ORDER — CEPHALEXIN 500 MG PO CAPS
500.0000 mg | ORAL_CAPSULE | Freq: Three times a day (TID) | ORAL | Status: DC
  Administered 2018-02-05 – 2018-02-06 (×3): 500 mg via ORAL
  Filled 2018-02-05 (×3): qty 1

## 2018-02-05 MED ORDER — TRAZODONE HCL 50 MG PO TABS: 50.0000 mg | ORAL_TABLET | Freq: Every evening | ORAL | Status: DC | PRN

## 2018-02-05 MED ORDER — DULOXETINE HCL 30 MG PO CPEP
30.0000 mg | ORAL_CAPSULE | Freq: Three times a day (TID) | ORAL | Status: DC
  Administered 2018-02-05 – 2018-02-06 (×2): 30 mg via ORAL
  Filled 2018-02-05 (×2): qty 1

## 2018-02-05 MED ORDER — ACETAMINOPHEN 500 MG PO TABS
1000.0000 mg | ORAL_TABLET | Freq: Once | ORAL | Status: AC
  Administered 2018-02-05: 1000 mg via ORAL
  Filled 2018-02-05: qty 2

## 2018-02-05 MED ORDER — ADULT MULTIVITAMIN W/MINERALS CH
1.0000 | ORAL_TABLET | Freq: Every day | ORAL | Status: DC
  Administered 2018-02-06: 1 via ORAL
  Filled 2018-02-05: qty 1

## 2018-02-05 MED ORDER — HYDROXYZINE HCL 25 MG PO TABS
25.0000 mg | ORAL_TABLET | Freq: Three times a day (TID) | ORAL | Status: DC | PRN
  Administered 2018-02-05 – 2018-02-06 (×2): 25 mg via ORAL
  Filled 2018-02-05 (×2): qty 1

## 2018-02-05 MED ORDER — TRAZODONE HCL 100 MG PO TABS
100.0000 mg | ORAL_TABLET | Freq: Every evening | ORAL | Status: DC | PRN
  Administered 2018-02-05: 100 mg via ORAL
  Filled 2018-02-05: qty 1

## 2018-02-05 MED ORDER — NICOTINE 21 MG/24HR TD PT24
21.0000 mg | MEDICATED_PATCH | Freq: Once | TRANSDERMAL | Status: DC
  Administered 2018-02-05: 21 mg via TRANSDERMAL
  Filled 2018-02-05: qty 1

## 2018-02-05 MED ORDER — ARIPIPRAZOLE 5 MG PO TABS
5.0000 mg | ORAL_TABLET | Freq: Every day | ORAL | Status: DC
  Administered 2018-02-05: 5 mg via ORAL
  Filled 2018-02-05: qty 1

## 2018-02-05 MED ORDER — LORAZEPAM 1 MG PO TABS
1.0000 mg | ORAL_TABLET | Freq: Once | ORAL | Status: AC
  Administered 2018-02-05: 1 mg via ORAL
  Filled 2018-02-05: qty 1

## 2018-02-05 MED ORDER — ATORVASTATIN CALCIUM 40 MG PO TABS: 40.0000 mg | ORAL_TABLET | Freq: Every day | ORAL | Status: DC

## 2018-02-05 MED ORDER — IBUPROFEN 200 MG PO TABS: 600.0000 mg | ORAL_TABLET | Freq: Three times a day (TID) | ORAL | Status: DC | PRN

## 2018-02-05 MED ORDER — ACETAMINOPHEN 325 MG PO TABS: 650.0000 mg | ORAL_TABLET | Freq: Four times a day (QID) | ORAL | Status: DC | PRN

## 2018-02-05 NOTE — ED Provider Notes (Signed)
Patient report received from Brooke [name], [place].  Patient presents with suicidal ideation with plan to overdose on Ambien. She is complaining of chest pain, anxiety, and shortness of breath. Recent diagnosed with CHF and has been depressed. Delta troponin pending (due at 1730). If negative, patient cleared for TTS evaluation.  6:08 PM Delta troponin negative. Patient is medically cleared, awaiting evaluation by TTS.   Felicie MornSmith, Jordane Hisle, NP 02/05/18 Mallie [id number]    Linwood DibblesKnapp, Jon, MD 02/06/18 [phone]

## 2018-02-05 NOTE — ED Provider Notes (Signed)
[facility]-EMERGENCY DEPT Provider Note   CSN: [id number] Arrival date & time: 02/05/18  1309     History   Chief Complaint Chief Complaint  Patient presents with  . Suicidal  . Shortness of Breath  . Chest Pain    HPI Brooke [name] is a 52 y.o. female.  Brooke [name] is a 52 y.o. Female with a history of depression, anxiety, CHF and degenerative disc disease, who presents to the emergency department for evaluation of chest pain and shortness of breath as well as worsening depression and anxiety with suicidal ideations.  Patient reports about 6 weeks ago she was diagnosed with CHF and started on medications for this, and since then she has been in an extremely depressed state.  She reports her depression has gotten so bad that it is affecting her daily functioning and now she has had worsening suicidal ideations over the past few days with plan to overdose on Ambien.  She denies any HI or AVH.  Reports she was previously on medications to help with her depression and anxiety but due to issue with her Medicaid has not been able to obtain these.  She reports chest pain started this morning and has been intermittent, it is worse with movement, it is not pleuritic in nature.  No radiation to the arm neck or jaw.  She reports some associated nausea but no vomiting and no abdominal pain.  No fevers or chills and no cough.  She does report some associated shortness of breath that is primarily present with movement.  No orthopnea or PND.  No worsening lower extremity swelling.  Denies urinary symptoms.  Has not taken anything for her symptoms prior to arrival.     Past Medical History:  Diagnosis Date  . CHF (congestive heart failure) (HCC)   . DDD (degenerative disc disease), lumbar   . DDD (degenerative disc disease), lumbar   . Osteoarthritis     Patient Active Problem List   Diagnosis Date Noted  . Tobacco abuse 12/27/2017  . Leukocytosis 12/27/2017  . Chest  pain 12/26/2017  . Dyspnea on exertion 12/18/2017  . Atypical chest pain 12/18/2017  . Generalized anxiety disorder   . Chronic pain syndrome   . MDD (major depressive disorder) 06/04/2017  . Suicidal ideation 06/02/2017  . MDD (major depressive disorder), recurrent severe, without psychosis (HCC)   . Dehydration 05/30/2017    Past Surgical History:  Procedure Laterality Date  . BACK SURGERY    . knee replacement Left   . TOTAL KNEE ARTHROPLASTY       OB History   None      Home Medications    Prior to Admission medications   Medication Sig Start Date End Date Taking? Authorizing Provider  ARIPiprazole (ABILIFY) 5 MG tablet Take 1 tablet (5 mg total) by mouth at bedtime. For mood control Patient not taking: Reported on 01/18/2018 06/30/17   Azalia Bilis, MD  atorvastatin (LIPITOR) 40 MG tablet Take 1 tablet (40 mg total) by mouth daily at 6 PM. 12/28/17   Joseph Art, DO  busPIRone (BUSPAR) 10 MG tablet Take 1 tablet (10 mg total) by mouth 3 (three) times daily. For mood control 06/30/17   Azalia Bilis, MD  clonazePAM (KLONOPIN) 0.5 MG tablet Take 1 tablet (0.5 mg total) by mouth at bedtime as needed (sleep). Patient not taking: Reported on 01/18/2018 06/11/17   Money, Gerlene Burdock, FNP  DULoxetine (CYMBALTA) 30 MG capsule Take 3 capsules (90  mg total) by mouth daily. For mood control 06/30/17   Azalia Bilis, MD  fluticasone [facility]) 50 MCG/ACT nasal spray Place 1 spray into both nostrils daily. 12/28/17   Joseph Art, DO  gabapentin (NEURONTIN) 300 MG capsule Take 300 mg by mouth 2 (two) times daily.    [provider]  guaiFENesin (ROBITUSSIN) 100 MG/5ML liquid Take 200 mg by mouth 3 (three) times daily as needed for cough.    [provider]  oxyCODONE (ROXICODONE) 15 MG immediate release tablet Take 15 mg by mouth 3 (three) times daily as needed for pain.    [provider]  traZODone (DESYREL) 100 MG tablet Take 1 tablet (100 mg total) by mouth  at bedtime as needed for sleep. 06/30/17   Azalia Bilis, MD    Family History Family History  Problem Relation Age of Onset  . Heart attack Mother   . Breast cancer Mother     Social History Social History   Tobacco Use  . Smoking status: Current Every Day Smoker    Packs/day: 2.00    Years: 29.00    Pack years: 58.00    Types: Cigarettes  . Smokeless tobacco: Never Used  Substance Use Topics  . Alcohol use: Not Currently  . Drug use: Never     Allergies   Iohexol and Sulfa antibiotics   Review of Systems Review of Systems  Constitutional: Negative for chills and fever.  HENT: Negative for congestion, rhinorrhea and sore throat.   Eyes: Negative for visual disturbance.  Respiratory: Positive for shortness of breath. Negative for cough, chest tightness and wheezing.   Cardiovascular: Positive for chest pain. Negative for leg swelling.  Gastrointestinal: Negative for abdominal pain, nausea and vomiting.  Genitourinary: Negative for dysuria, flank pain and frequency.  Musculoskeletal: Negative for arthralgias and myalgias.  Skin: Negative for color change.  Neurological: Negative for dizziness, syncope, light-headedness and headaches.  Psychiatric/Behavioral: Positive for dysphoric mood and suicidal ideas. The patient is nervous/anxious.      Physical Exam Updated Vital Signs BP 120/74 (BP Location: Right Arm)   Pulse 78   Temp 97.8 F (36.6 C) (Oral)   Resp 20   Ht 5\' 2"  (1.575 m)   Wt 72.6 kg   SpO2 100%   BMI 29.26 kg/m   Physical Exam  Constitutional: She is oriented to person, place, and time. She appears well-developed and well-nourished. No distress.  HENT:  Head: Normocephalic and atraumatic.  Mouth/Throat: Oropharynx is clear and moist.  Eyes: Pupils are equal, round, and reactive to light. EOM are normal. Right eye exhibits no discharge. Left eye exhibits no discharge.  Neck: Neck supple.  Cardiovascular: Normal rate, regular rhythm, normal  heart sounds and intact distal pulses. Exam reveals no gallop and no friction rub.  No murmur heard. Pulmonary/Chest: Effort normal and breath sounds normal. No respiratory distress. She has no wheezes. She has no rales.  Respirations equal and unlabored, patient able to speak in full sentences, lungs clear to auscultation bilaterally, chest pain reproducible with palpation over the sternum on exam  Abdominal: Soft. Bowel sounds are normal. She exhibits no distension and no mass. There is no tenderness. There is no guarding.  Musculoskeletal: She exhibits no edema or deformity.  Neurological: She is alert and oriented to person, place, and time. Coordination normal.  Speech is clear, able to follow commands CN III-XII intact Normal strength in upper and lower extremities bilaterally including dorsiflexion and plantar flexion, strong and equal grip strength  Sensation normal to light and sharp touch Moves extremities without ataxia, coordination intact  Skin: Skin is warm and dry. Capillary refill takes less than 2 seconds. She is not diaphoretic.  Psychiatric: Her speech is normal. Her mood appears anxious. She is withdrawn. She is not actively hallucinating. She exhibits a depressed mood. She expresses suicidal ideation. She expresses no homicidal ideation. She expresses suicidal plans. She expresses no homicidal plans.  Nursing note and vitals reviewed.    ED Treatments / Results  Labs (all labs ordered are listed, but only abnormal results are displayed) Labs Reviewed  COMPREHENSIVE METABOLIC PANEL - Abnormal; Notable for the following components:      Result Value   Total Protein 8.3 (*)    Alkaline Phosphatase 146 (*)    All other components within normal limits  ACETAMINOPHEN LEVEL - Abnormal; Notable for the following components:   Acetaminophen (Tylenol), Serum <10 (*)    All other components within normal limits  CBC - Abnormal; Notable for the following components:   WBC 14.0  (*)    All other components within normal limits  URINALYSIS, ROUTINE W REFLEX MICROSCOPIC - Abnormal; Notable for the following components:   Color, Urine STRAW (*)    Specific Gravity, Urine 1.003 (*)    Hgb urine dipstick MODERATE (*)    Leukocytes, UA MODERATE (*)    Bacteria, UA MANY (*)    All other components within normal limits  I-STAT BETA HCG BLOOD, ED ([facility], [facility], [facility] ONLY) - Abnormal; Notable for the following components:   I-stat hCG, quantitative 12.5 (*)    All other components within normal limits  URINE CULTURE  ETHANOL  SALICYLATE LEVEL  RAPID URINE DRUG SCREEN, HOSP PERFORMED  BRAIN NATRIURETIC PEPTIDE  I-STAT TROPONIN, ED    EKG EKG Interpretation  Date/Time:  Tuesday February 05 2018 13:35:44 EST Ventricular Rate:  84 PR Interval:    QRS Duration: 96 QT Interval:  385 QTC Calculation: 456 R Axis:   103 Text Interpretation:  Sinus arrhythmia Right axis deviation Low voltage, precordial leads RSR' in V1 or V2, probably normal variant Borderline T abnormalities, anterior leads Confirmed by Geoffery LyonseLo, [place] ([id number]) on 02/05/2018 3:34:34 PM   Radiology Dg Chest 2 View  Result Date: 02/05/2018 CLINICAL DATA:  Left-sided chest pain EXAM: CHEST - 2 VIEW COMPARISON:  12/26/2017 FINDINGS: Cardiac shadow is stable. The lungs are well aerated bilaterally. Mild vascular congestion is again seen without significant pulmonary edema. No focal infiltrate or sizable effusion is noted. No bony abnormality is seen. Postsurgical changes in the cervical spine are noted. IMPRESSION: Mild vascular congestion without significant edema. Electronically Signed   By: Alcide CleverMark  Lukens M.D.   On: 02/05/2018 14:10    Procedures Procedures (including critical care time)  Medications Ordered in ED Medications  ondansetron (ZOFRAN-ODT) disintegrating tablet 4 mg (has no administration in time range)  acetaminophen (TYLENOL) tablet 1,000 mg (has no administration in time range)  cephALEXin  (KEFLEX) capsule 500 mg (has no administration in time range)  LORazepam (ATIVAN) tablet 1 mg (1 mg Oral Given 02/05/18 1524)     Initial Impression / Assessment and Plan / ED Course  I have reviewed the triage vital signs and the nursing notes.  Pertinent labs & imaging results that were available during my care of the patient were reviewed by me and considered in my medical decision making (see chart for details).  Patient presents to the emergency department for evaluation of chest pain redness shortness of  breath as well as worsening depression and suicidal ideations.  She was recently diagnosed with CHF 6 weeks ago and since then has become increasingly depressed and anxious.  She reports compliance with her medications most of the time but has forgotten to take them sometimes.  Reports stable lower extremity edema that has not significantly worsened recently.  She reports central and left-sided chest pain which is reproducible with palpation on exam.  Patient reports a mild shortness of breath but is in no respiratory distress clear lungs without crackles.  Vitals are normal.  Patient appears extremely anxious.  Reports she is feeling increasingly suicidal with plans to overdose on Ambien which she has been saving up at home.  She was on medication to help manage her depression and anxiety but has not been able to get this refilled due to issues with her Medicaid.  Will get chest pain labs including troponin, and BNP, as well as medical clearance labs chest x-ray and EKG ordered as well.  Tylenol, Zofran and Ativan ordered for symptomatic management.  EKG without concerning ischemic changes and initial troponin is negative.  Chest x-ray shows some mild pulmonary vascular congestion with no edema or other acute pulmonary findings.  Patient does have leukocytosis of 14 has not had any fevers or focal infectious symptoms, normal hemoglobin, given leukocytosis urinalysis ordered as well.  No acute  electrolyte derangements, normal renal and liver function.  Acetaminophen, ethanol and salicylate levels unremarkable and UDS negative.  Urinalysis does show moderate leukocytes and many bacteria suggestive of infection, will start patient on Keflex and send urine culture.  Given stable vitals do not think patient would require admission for urinary tract infection and she does not show signs of pyelonephritis.  Initial cardiac work-up is reassuring the patient will need delta troponin at 5:30 PM, if this is negative patient patient's symptoms of are improved she will be medically cleared for psychiatric evaluation, TTS consult has been placed, patient's depression has started to cause significant dysfunction within her daily life and she is had worsening thoughts of SI with plan and feel she would benefit from psychiatric evaluation.  At shift change care signed out to NP Felicie Morn who will follow-up on delta troponin, reassess patient in follow-up on behavioral health recommendations for appropriate disposition.  Final Clinical Impressions(s) / ED Diagnoses   Final diagnoses:  Chest pain, unspecified type  Shortness of breath  Suicidal ideation  Depression, unspecified depression type    ED Discharge Orders    None       Dartha Lodge, [place] 02/05/18 1639    Geoffery Lyons, MD 02/06/18 2113
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
## 2018-02-05 NOTE — ED Notes (Addendum)
Pt continues to ask for oxycontin  and stated that she takes it three times a day.  EDP aware. Pts urine drug screen is negative.

## 2018-02-05 NOTE — ED Triage Notes (Addendum)
Pt presents with c/o chest pain, anxiety, shortness of breath, and suicidal ideation. Pt reports she was recently diagnosed with congestive heart failure and has been in a very depressive state recently. Pt reports that she does have a plan for suicide.

## 2018-02-06 ENCOUNTER — Encounter (HOSPITAL_COMMUNITY): Payer: Self-pay | Admitting: *Deleted

## 2018-02-06 ENCOUNTER — Other Ambulatory Visit: Payer: Self-pay

## 2018-02-06 ENCOUNTER — Inpatient Hospital Stay (HOSPITAL_COMMUNITY)
Admission: AD | Admit: 2018-02-06 | Discharge: 2018-02-13 | DRG: 885 | Disposition: A | Discharge status: Home / Self Care | Payer: MEDICAID | Source: Intra-hospital | Attending: Psychiatry | Admitting: Psychiatry

## 2018-02-06 DIAGNOSIS — Z888 Allergy status to other drugs, medicaments and biological substances status: Secondary | ICD-10-CM

## 2018-02-06 DIAGNOSIS — G4700 Insomnia, unspecified: Secondary | ICD-10-CM | POA: Diagnosis present

## 2018-02-06 DIAGNOSIS — Z882 Allergy status to sulfonamides status: Secondary | ICD-10-CM | POA: Diagnosis not present

## 2018-02-06 DIAGNOSIS — F4310 Post-traumatic stress disorder, unspecified: Secondary | ICD-10-CM | POA: Diagnosis present

## 2018-02-06 DIAGNOSIS — M5136 Other intervertebral disc degeneration, lumbar region: Secondary | ICD-10-CM | POA: Diagnosis present

## 2018-02-06 DIAGNOSIS — M1990 Unspecified osteoarthritis, unspecified site: Secondary | ICD-10-CM | POA: Diagnosis present

## 2018-02-06 DIAGNOSIS — F329 Major depressive disorder, single episode, unspecified: Secondary | ICD-10-CM | POA: Diagnosis not present

## 2018-02-06 DIAGNOSIS — F332 Major depressive disorder, recurrent severe without psychotic features: Principal | ICD-10-CM | POA: Diagnosis present

## 2018-02-06 DIAGNOSIS — M549 Dorsalgia, unspecified: Secondary | ICD-10-CM | POA: Diagnosis present

## 2018-02-06 DIAGNOSIS — F17210 Nicotine dependence, cigarettes, uncomplicated: Secondary | ICD-10-CM | POA: Diagnosis present

## 2018-02-06 DIAGNOSIS — Z7951 Long term (current) use of inhaled steroids: Secondary | ICD-10-CM

## 2018-02-06 DIAGNOSIS — Z803 Family history of malignant neoplasm of breast: Secondary | ICD-10-CM

## 2018-02-06 DIAGNOSIS — I110 Hypertensive heart disease with heart failure: Secondary | ICD-10-CM | POA: Diagnosis present

## 2018-02-06 DIAGNOSIS — R45851 Suicidal ideations: Secondary | ICD-10-CM | POA: Diagnosis present

## 2018-02-06 DIAGNOSIS — Z818 Family history of other mental and behavioral disorders: Secondary | ICD-10-CM

## 2018-02-06 DIAGNOSIS — Z96652 Presence of left artificial knee joint: Secondary | ICD-10-CM | POA: Diagnosis present

## 2018-02-06 DIAGNOSIS — F419 Anxiety disorder, unspecified: Secondary | ICD-10-CM

## 2018-02-06 DIAGNOSIS — F322 Major depressive disorder, single episode, severe without psychotic features: Secondary | ICD-10-CM | POA: Diagnosis present

## 2018-02-06 DIAGNOSIS — F13239 Sedative, hypnotic or anxiolytic dependence with withdrawal, unspecified: Secondary | ICD-10-CM | POA: Diagnosis present

## 2018-02-06 DIAGNOSIS — R440 Auditory hallucinations: Secondary | ICD-10-CM | POA: Diagnosis present

## 2018-02-06 DIAGNOSIS — G8929 Other chronic pain: Secondary | ICD-10-CM | POA: Diagnosis present

## 2018-02-06 DIAGNOSIS — I509 Heart failure, unspecified: Secondary | ICD-10-CM | POA: Diagnosis present

## 2018-02-06 DIAGNOSIS — Z8249 Family history of ischemic heart disease and other diseases of the circulatory system: Secondary | ICD-10-CM | POA: Diagnosis not present

## 2018-02-06 MED ORDER — PNEUMOCOCCAL VAC POLYVALENT 25 MCG/0.5ML IJ INJ: 0.5000 mL | INJECTION | INTRAMUSCULAR | Status: DC

## 2018-02-06 MED ORDER — ARIPIPRAZOLE 5 MG PO TABS
5.0000 mg | ORAL_TABLET | Freq: Every day | ORAL | Status: DC
  Administered 2018-02-06 – 2018-02-12 (×7): 5 mg via ORAL
  Filled 2018-02-06: qty 7
  Filled 2018-02-06 (×8): qty 1

## 2018-02-06 MED ORDER — ALUM & MAG HYDROXIDE-SIMETH 200-200-20 MG/5ML PO SUSP: 30.0000 mL | ORAL | Status: DC | PRN

## 2018-02-06 MED ORDER — OXYCODONE HCL 5 MG PO TABS
15.0000 mg | ORAL_TABLET | Freq: Two times a day (BID) | ORAL | Status: DC | PRN
  Administered 2018-02-06 – 2018-02-07 (×3): 15 mg via ORAL
  Filled 2018-02-06 (×4): qty 3

## 2018-02-06 MED ORDER — TRAZODONE HCL 100 MG PO TABS: 100.0000 mg | ORAL_TABLET | Freq: Every evening | ORAL | Status: DC | PRN

## 2018-02-06 MED ORDER — MAGNESIUM HYDROXIDE 400 MG/5ML PO SUSP: 30.0000 mL | Freq: Every day | ORAL | Status: DC | PRN

## 2018-02-06 MED ORDER — LOPERAMIDE HCL 2 MG PO CAPS: 2.0000 mg | ORAL_CAPSULE | ORAL | Status: DC | PRN

## 2018-02-06 MED ORDER — NAPROXEN 500 MG PO TABS: 500.0000 mg | ORAL_TABLET | Freq: Two times a day (BID) | ORAL | Status: DC | PRN

## 2018-02-06 MED ORDER — ADULT MULTIVITAMIN W/MINERALS CH
1.0000 | ORAL_TABLET | Freq: Every day | ORAL | Status: DC
  Administered 2018-02-06 – 2018-02-09 (×4): 1 via ORAL
  Filled 2018-02-06 (×5): qty 1

## 2018-02-06 MED ORDER — ADULT MULTIVITAMIN W/MINERALS CH
1.0000 | ORAL_TABLET | Freq: Every day | ORAL | Status: DC
  Filled 2018-02-06: qty 1

## 2018-02-06 MED ORDER — VITAMIN B-1 100 MG PO TABS
100.0000 mg | ORAL_TABLET | Freq: Every day | ORAL | Status: DC
  Administered 2018-02-07 – 2018-02-09 (×3): 100 mg via ORAL
  Filled 2018-02-06 (×4): qty 1

## 2018-02-06 MED ORDER — DICYCLOMINE HCL 20 MG PO TABS: 20.0000 mg | ORAL_TABLET | Freq: Four times a day (QID) | ORAL | Status: DC | PRN

## 2018-02-06 MED ORDER — HYDROXYZINE HCL 25 MG PO TABS: 25.0000 mg | ORAL_TABLET | Freq: Three times a day (TID) | ORAL | Status: DC | PRN

## 2018-02-06 MED ORDER — GABAPENTIN 100 MG PO CAPS
100.0000 mg | ORAL_CAPSULE | Freq: Two times a day (BID) | ORAL | Status: DC
  Administered 2018-02-06 – 2018-02-08 (×4): 100 mg via ORAL
  Filled 2018-02-06 (×7): qty 1

## 2018-02-06 MED ORDER — DULOXETINE HCL 60 MG PO CPEP
60.0000 mg | ORAL_CAPSULE | Freq: Every day | ORAL | Status: DC
  Administered 2018-02-07 – 2018-02-09 (×3): 60 mg via ORAL
  Filled 2018-02-06 (×4): qty 1

## 2018-02-06 MED ORDER — CLONIDINE HCL 0.1 MG PO TABS: 0.1000 mg | ORAL_TABLET | ORAL | Status: DC

## 2018-02-06 MED ORDER — METHOCARBAMOL 500 MG PO TABS: 500.0000 mg | ORAL_TABLET | Freq: Three times a day (TID) | ORAL | Status: DC | PRN

## 2018-02-06 MED ORDER — OXYCODONE HCL 5 MG PO TABS: 15.0000 mg | ORAL_TABLET | Freq: Three times a day (TID) | ORAL | Status: DC | PRN

## 2018-02-06 MED ORDER — NICOTINE 21 MG/24HR TD PT24
21.0000 mg | MEDICATED_PATCH | Freq: Every day | TRANSDERMAL | Status: DC
  Administered 2018-02-06 – 2018-02-13 (×8): 21 mg via TRANSDERMAL
  Filled 2018-02-06 (×11): qty 1

## 2018-02-06 MED ORDER — HYDROXYZINE HCL 25 MG PO TABS
25.0000 mg | ORAL_TABLET | Freq: Four times a day (QID) | ORAL | Status: AC | PRN
  Filled 2018-02-06: qty 1

## 2018-02-06 MED ORDER — BUSPIRONE HCL 10 MG PO TABS
10.0000 mg | ORAL_TABLET | Freq: Three times a day (TID) | ORAL | Status: DC
  Administered 2018-02-06: 10 mg via ORAL
  Filled 2018-02-06: qty 1
  Filled 2018-02-06: qty 2
  Filled 2018-02-06 (×2): qty 1

## 2018-02-06 MED ORDER — CEPHALEXIN 500 MG PO CAPS
500.0000 mg | ORAL_CAPSULE | Freq: Three times a day (TID) | ORAL | Status: DC
  Administered 2018-02-06 – 2018-02-11 (×15): 500 mg via ORAL
  Filled 2018-02-06 (×2): qty 1
  Filled 2018-02-06: qty 2
  Filled 2018-02-06 (×9): qty 1
  Filled 2018-02-06: qty 2
  Filled 2018-02-06 (×7): qty 1

## 2018-02-06 MED ORDER — TRAZODONE HCL 50 MG PO TABS
50.0000 mg | ORAL_TABLET | Freq: Every evening | ORAL | Status: DC | PRN
  Administered 2018-02-06: 50 mg via ORAL
  Filled 2018-02-06: qty 1

## 2018-02-06 MED ORDER — IBUPROFEN 600 MG PO TABS: 600.0000 mg | ORAL_TABLET | Freq: Three times a day (TID) | ORAL | Status: DC | PRN

## 2018-02-06 MED ORDER — LOPERAMIDE HCL 2 MG PO CAPS: 2.0000 mg | ORAL_CAPSULE | ORAL | Status: AC | PRN

## 2018-02-06 MED ORDER — ATORVASTATIN CALCIUM 40 MG PO TABS
40.0000 mg | ORAL_TABLET | Freq: Every day | ORAL | Status: DC
  Administered 2018-02-06 – 2018-02-12 (×7): 40 mg via ORAL
  Filled 2018-02-06 (×10): qty 1

## 2018-02-06 MED ORDER — CLONIDINE HCL 0.1 MG PO TABS: 0.1000 mg | ORAL_TABLET | Freq: Every day | ORAL | Status: DC

## 2018-02-06 MED ORDER — DULOXETINE HCL 30 MG PO CPEP
30.0000 mg | ORAL_CAPSULE | Freq: Three times a day (TID) | ORAL | Status: DC
  Administered 2018-02-06: 30 mg via ORAL
  Filled 2018-02-06 (×4): qty 1

## 2018-02-06 MED ORDER — ONDANSETRON 4 MG PO TBDP: 4.0000 mg | ORAL_TABLET | Freq: Four times a day (QID) | ORAL | Status: AC | PRN

## 2018-02-06 MED ORDER — OXYCODONE HCL 5 MG PO TABS
15.0000 mg | ORAL_TABLET | Freq: Three times a day (TID) | ORAL | Status: DC | PRN
  Administered 2018-02-06: 15 mg via ORAL
  Filled 2018-02-06: qty 3

## 2018-02-06 MED ORDER — ONDANSETRON 4 MG PO TBDP: 4.0000 mg | ORAL_TABLET | Freq: Four times a day (QID) | ORAL | Status: DC | PRN

## 2018-02-06 MED ORDER — LORAZEPAM 1 MG PO TABS
1.0000 mg | ORAL_TABLET | Freq: Four times a day (QID) | ORAL | Status: DC | PRN
  Administered 2018-02-07 – 2018-02-08 (×3): 1 mg via ORAL
  Filled 2018-02-06 (×3): qty 1

## 2018-02-06 MED ORDER — CLONIDINE HCL 0.1 MG PO TABS
0.1000 mg | ORAL_TABLET | Freq: Four times a day (QID) | ORAL | Status: DC
  Filled 2018-02-06 (×4): qty 1

## 2018-02-06 NOTE — ED Notes (Signed)
Patient reports SI with a plan to overdose. Patient denies HI/AVH. Plan of care discussed. Encouragement and support provided and safety maintain. Q 15 min safety checks in place and video monitoring.

## 2018-02-06 NOTE — ED Notes (Signed)
Pt discharged safely with [place] driver.   All belongings were returned to pt.

## 2018-02-06 NOTE — Consult Note (Addendum)
[facility]   Reason for Consult:  SI Referring Physician:  EDP Patient Identification: [name] MRN:  [id number] Principal Diagnosis: MDD (major depressive disorder), recurrent severe, without psychosis (HCC) Diagnosis:  Principal Problem:   MDD (major depressive disorder), recurrent severe, without psychosis (HCC)   Total Time spent with patient: 30 minutes  Subjective:   Brooke [name] is a 52 y.o. female patient admitted with SI.  HPI:   [name] PulseHucks reports worsening depression and anxiety for several months. She started classes at [employer] in August and has found it difficult to go to class. She endorses SI with a plan to overdose on Ambien. She endorses AH. She denies HI or VH. [facility]. She has not followed up due to being unable to afford her medications. She [facility]. She reports a history of chronic back pain. She briefly took Suboxone. It stopped helping with pain so she was restarted on Oxycodone. [facility] and confirmed that patient is prescribed Oxycodone and the provider will continue to prescribe it.   Past Psychiatric History: MDD  Risk to Self: Suicidal Ideation: Yes-Currently Present Suicidal Intent: Yes-Currently Present Is patient at risk for suicide?: Yes Suicidal Plan?: Yes-Currently Present Specify Current Suicidal Plan: OD on Ambien Access to Means: Yes Specify Access to Suicidal Means: ambien What has been your use of drugs/alcohol within the last 12 months?: pt denies hx of use How many times?: 1 Triggers for Past Attempts: Unknown Intentional Self Injurious Behavior: None Risk to Others: Homicidal Ideation: No Thoughts of Harm to Others: No Current Homicidal Intent: No Current Homicidal Plan: No Access to Homicidal Means: No History of harm to others?: No Assessment of Violence: None Noted Does patient  have access to weapons?: No Criminal Charges Pending?: No Does patient have a court date: No Prior Inpatient Therapy: Prior Inpatient Therapy: Yes Prior Therapy Dates: 05/2017 [facility] Reason for Treatment: SI Prior Outpatient Therapy: Prior Outpatient Therapy: Yes Prior Therapy Dates: May-August 2019 [facility] Reason for Treatment: MDD; PTSD; GAD Does patient have an ACCT team?: No Does patient have Intensive In-House Services?  : No Does patient have [facility] services? : No Does patient have P4CC services?: No  Past Medical History:  Past Medical History:  Diagnosis Date  . CHF (congestive heart failure) (HCC)   . DDD (degenerative disc disease), lumbar   . DDD (degenerative disc disease), lumbar   . Osteoarthritis     Past Surgical History:  Procedure Laterality Date  . BACK SURGERY    . knee replacement Left   . TOTAL KNEE ARTHROPLASTY     Family History:  Family History  Problem Relation Age of Onset  . Heart attack Mother   . Breast cancer Mother    Family Psychiatric  History: None per chart review.  Social History:  Social History   Substance and Sexual Activity  Alcohol Use Not Currently     Social History   Substance and Sexual Activity  Drug Use Never    Social History   Socioeconomic History  . Marital status: Single    Spouse name: Not on file  . Number of children: Not on file  . Years of education: Not on file  . Highest education level: Not on file  Occupational History  . Not on file  Social Needs  . Financial  resource strain: Not on file  . Food insecurity:    Worry: Not on file    Inability: Not on file  . Transportation needs:    Medical: Not on file    Non-medical: Not on file  Tobacco Use  . Smoking status: Current Every Day Smoker    Packs/day: 2.00    Years: 29.00    Pack years: 58.00    Types: Cigarettes  . Smokeless tobacco: Never Used   Substance and Sexual Activity  . Alcohol use: Not Currently  . Drug use: Never  . Sexual activity: Not on file  Lifestyle  . Physical activity:    Days per week: Not on file    Minutes per session: Not on file  . Stress: Not on file  Relationships  . Social connections:    Talks on phone: Not on file    Gets together: Not on file    Attends religious service: Not on file    Active member of club or organization: Not on file    Attends meetings of clubs or organizations: Not on file    Relationship status: Not on file  Other Topics Concern  . Not on file  Social History Narrative  . Not on file   Additional Social History: N/A    Allergies:   Allergies  Allergen Reactions  . Iohexol      Code: HIVES, Desc: dye allergy-throat closes up, SOB, chest pain, Onset Date: 16109604   . Sulfa Antibiotics     Labs:  Results for orders placed or performed during the hospital encounter of 02/05/18 (from the past 48 hour(s))  Comprehensive metabolic panel     Status: Abnormal   Collection Time: 02/05/18  2:23 PM  Result Value Ref Range   Sodium 138 135 - 145 mmol/L   Potassium 4.0 3.5 - 5.1 mmol/L   Chloride 103 98 - 111 mmol/L   CO2 24 22 - 32 mmol/L   Glucose, Bld 84 70 - 99 mg/dL   BUN 8 6 - 20 mg/dL   Creatinine, Ser 5.40 0.44 - 1.00 mg/dL   Calcium 9.7 8.9 - 98.1 mg/dL   Total Protein 8.3 (H) 6.5 - 8.1 g/dL   Albumin 4.6 3.5 - 5.0 g/dL   AST 23 15 - 41 U/L   ALT 25 0 - 44 U/L   Alkaline Phosphatase 146 (H) 38 - 126 U/L   Total Bilirubin 0.4 0.3 - 1.2 mg/dL   GFR calc non Af Amer >60 >60 mL/min   GFR calc Af Amer >60 >60 mL/min   Anion gap 11 5 - 15    Comment: Performed at [facility], [address], [place], [place] [address]  Ethanol     Status: None   Collection Time: 02/05/18  2:23 PM  Result Value Ref Range   Alcohol, Ethyl (B) <10 <10 mg/dL    Comment: (NOTE) Lowest detectable limit for serum alcohol is 10 mg/dL. For medical purposes  only. Performed at [facility], [address] [place], [place] [address]   Salicylate level     Status: None   Collection Time: 02/05/18  2:23 PM  Result Value Ref Range   Salicylate Lvl <7.0 2.8 - 30.0 mg/dL    Comment: Performed at [facility], [address], [place], [place] [address]  Acetaminophen level     Status: Abnormal   Collection Time: 02/05/18  2:23 PM  Result Value Ref Range  Acetaminophen (Tylenol), Serum <10 (L) 10 - 30 ug/mL    Comment: (NOTE) Therapeutic concentrations vary significantly. A range of 10-30 ug/mL  may be an effective concentration for many patients. However, some  are best treated at concentrations outside of this range. Acetaminophen concentrations >150 ug/mL at 4 hours after ingestion  and >50 ug/mL at 12 hours after ingestion are often associated with  toxic reactions. Performed at [facility], [address] [place], [place] [address]   cbc     Status: Abnormal   Collection Time: 02/05/18  2:23 PM  Result Value Ref Range   WBC 14.0 (H) 4.0 - 10.5 K/uL   RBC 4.58 3.87 - 5.11 MIL/uL   Hemoglobin 13.4 12.0 - 15.0 g/dL   HCT 60.4 54.0 - 98.1 %   MCV 92.6 80.0 - 100.0 fL   MCH 29.3 26.0 - 34.0 pg   MCHC 31.6 30.0 - 36.0 g/dL   RDW 19.1 47.8 - 29.5 %   Platelets 334 150 - 400 K/uL   nRBC 0.0 0.0 - 0.2 %    Comment: Performed at [facility], [address] [place], [place] [address]  Brain natriuretic peptide     Status: None   Collection Time: 02/05/18  2:27 PM  Result Value Ref Range   B Natriuretic Peptide 16.0 0.0 - 100.0 pg/mL    Comment: Performed at [facility], [address], [place], [place] [address]  I-stat troponin, ED     Status: None   Collection Time: 02/05/18  2:35 PM  Result Value Ref Range   Troponin i, poc 0.06 0.00 - 0.08 ng/mL   Comment 3            Comment: Due to the release kinetics of cTnI, a negative result  within the first hours of the onset of symptoms does not rule out myocardial infarction with certainty. If myocardial infarction is still suspected, repeat the test at appropriate intervals.   I-Stat beta hCG blood, ED     Status: Abnormal   Collection Time: 02/05/18  2:36 PM  Result Value Ref Range   I-stat hCG, quantitative 12.5 (H) <5 mIU/mL   Comment 3            Comment:   GEST. AGE      CONC.  (mIU/mL)   <=1 WEEK        5 - 50     2 WEEKS       50 - 500     3 WEEKS       100 - 10,000     4 WEEKS     1,000 - 30,000        FEMALE AND NON-PREGNANT FEMALE:     LESS THAN 5 mIU/mL   Rapid urine drug screen (hospital performed)     Status: None   Collection Time: 02/05/18  3:33 PM  Result Value Ref Range   Opiates NONE DETECTED NONE DETECTED   Cocaine NONE DETECTED NONE DETECTED   Benzodiazepines NONE DETECTED NONE DETECTED   Amphetamines NONE DETECTED NONE DETECTED   Tetrahydrocannabinol NONE DETECTED NONE DETECTED   Barbiturates NONE DETECTED NONE DETECTED    Comment: (NOTE) DRUG SCREEN FOR MEDICAL PURPOSES ONLY.  IF CONFIRMATION IS NEEDED FOR ANY PURPOSE, NOTIFY LAB WITHIN 5 DAYS. LOWEST DETECTABLE LIMITS FOR URINE DRUG SCREEN Drug Class  Cutoff (ng/mL) Amphetamine and metabolites    1000 Barbiturate and metabolites    200 Benzodiazepine                 200 Tricyclics and metabolites     300 Opiates and metabolites        300 Cocaine and metabolites        300 THC                            50 Performed at [facility], [address], [place], [place] [address]   Urinalysis, Routine w reflex microscopic     Status: Abnormal   Collection Time: 02/05/18  3:33 PM  Result Value Ref Range   Color, Urine STRAW (A) YELLOW   APPearance CLEAR CLEAR   Specific Gravity, Urine 1.003 (L) 1.005 - 1.030   pH 6.0 5.0 - 8.0   Glucose, UA NEGATIVE NEGATIVE mg/dL   Hgb urine dipstick MODERATE (A) NEGATIVE   Bilirubin Urine NEGATIVE NEGATIVE    Ketones, ur NEGATIVE NEGATIVE mg/dL   Protein, ur NEGATIVE NEGATIVE mg/dL   Nitrite NEGATIVE NEGATIVE   Leukocytes, UA MODERATE (A) NEGATIVE   RBC / HPF 0-5 0 - 5 RBC/hpf   WBC, UA 6-10 0 - 5 WBC/hpf   Bacteria, UA MANY (A) NONE SEEN   Squamous Epithelial / LPF 0-5 0 - 5    Comment: Performed at [facility], [address], [place], [place] [address]  I-stat troponin, ED     Status: None   Collection Time: 02/05/18  5:54 PM  Result Value Ref Range   Troponin i, poc 0.00 0.00 - 0.08 ng/mL   Comment 3            Comment: Due to the release kinetics of cTnI, a negative result within the first hours of the onset of symptoms does not rule out myocardial infarction with certainty. If myocardial infarction is still suspected, repeat the test at appropriate intervals.     Current Facility-Administered Medications  Medication Dose Route Frequency Provider Last Rate Last Dose  . acetaminophen (TYLENOL) tablet 650 mg  650 mg Oral Q6H PRN Linwood Dibbles, MD      . ARIPiprazole (ABILIFY) tablet 5 mg  5 mg Oral QHS Linwood Dibbles, MD   5 mg at 02/05/18 2141  . atorvastatin (LIPITOR) tablet 40 mg  40 mg Oral q1800 Linwood Dibbles, MD      . busPIRone (BUSPAR) tablet 10 mg  10 mg Oral TID Linwood Dibbles, MD   10 mg at 02/06/18 0943  . cephALEXin (KEFLEX) capsule 500 mg  500 mg Oral Q8H Jodi Geralds N, PA-C   500 mg at 02/06/18 0636  . DULoxetine (CYMBALTA) DR capsule 30 mg  30 mg Oral TID Linwood Dibbles, MD   30 mg at 02/06/18 0943  . hydrOXYzine (ATARAX/VISTARIL) tablet 25 mg  25 mg Oral TID PRN Nira Conn A, NP   25 mg at 02/06/18 0943  . ibuprofen (ADVIL,MOTRIN) tablet 600 mg  600 mg Oral TID PRN Linwood Dibbles, MD      . multivitamin with minerals tablet 1 tablet  1 tablet Oral Daily Linwood Dibbles, MD   1 tablet at 02/06/18 0943  . nicotine (NICODERM CQ - dosed in mg/24 hours) patch 21 mg  21 mg Transdermal Once Cherly Beach, DO   21 mg at 02/05/18 1810  . traZODone (DESYREL) tablet 100  mg  100 mg Oral QHS PRN Linwood Dibbles, MD   100 mg at 02/05/18 2145  . traZODone (DESYREL) tablet 50 mg  50 mg Oral QHS,MR X 1 Jackelyn Poling, NP       Current Outpatient Medications  Medication Sig Dispense Refill  . ARIPiprazole (ABILIFY) 5 MG tablet Take 1 tablet (5 mg total) by mouth at bedtime. For mood control 30 tablet 0  . atorvastatin (LIPITOR) 40 MG tablet Take 1 tablet (40 mg total) by mouth daily at 6 PM. 30 tablet 0  . busPIRone (BUSPAR) 10 MG tablet Take 1 tablet (10 mg total) by mouth 3 (three) times daily. For mood control 90 tablet 0  . clonazePAM (KLONOPIN) 1 MG tablet Take 1 mg by mouth 2 (two) times daily.    . DULoxetine (CYMBALTA) 30 MG capsule Take 3 capsules (90 mg total) by mouth daily. For mood control (Patient taking differently: Take 30 mg by mouth 3 (three) times daily. For mood control) 90 capsule 0  . fluticasone (FLONASE) 50 MCG/ACT nasal spray Place 1 spray into both nostrils daily. 11.1 g 0  . gabapentin (NEURONTIN) 300 MG capsule Take 300 mg by mouth 2 (two) times daily.    Marland Kitchen guaiFENesin (ROBITUSSIN) 100 MG/5ML liquid Take 200 mg by mouth 2 (two) times daily as needed for cough.     . Multiple Vitamins-Minerals (MULTIVITAMIN ADULTS 50+) TABS Take 1 tablet by mouth daily.    Marland Kitchen oxyCODONE (ROXICODONE) 15 MG immediate release tablet Take 15 mg by mouth 3 (three) times daily as needed for pain.    . traZODone (DESYREL) 100 MG tablet Take 1 tablet (100 mg total) by mouth at bedtime as needed for sleep. (Patient taking differently: Take 100 mg by mouth at bedtime. ) 30 tablet 0  . clonazePAM (KLONOPIN) 0.5 MG tablet Take 1 tablet (0.5 mg total) by mouth at bedtime as needed (sleep). (Patient not taking: Reported on 02/05/2018) 7 tablet 0    Musculoskeletal: Strength & Muscle Tone: within normal limits Gait & Station: UTA since patient is lying in bed. Patient leans: N/A  Psychiatric Specialty Exam: Physical Exam  Nursing note and vitals reviewed. Constitutional:  She is oriented to person, place, and time. She appears well-developed and well-nourished.  HENT:  Head: Normocephalic and atraumatic.  Neck: Normal range of motion.  Respiratory: Effort normal.  Musculoskeletal: Normal range of motion.  Neurological: She is alert and oriented to person, place, and time.  Psychiatric: Her speech is normal and behavior is normal. Judgment normal. Cognition and memory are normal. She exhibits a depressed mood. She expresses suicidal ideation. She expresses suicidal plans.    Review of Systems  Psychiatric/Behavioral: Positive for depression and suicidal ideas. Negative for hallucinations and substance abuse.  All other systems reviewed and are negative.   Blood pressure 112/75, pulse 69, temperature 98.6 F (37 C), temperature source Oral, resp. rate 16, height 5\' 2"  (1.575 m), weight 72.6 kg, SpO2 91 %.Body mass index is 29.26 kg/m.  General Appearance: Fairly Groomed, middle aged, Caucasian female, wearing paper hospital scrubs and lying in bed. NAD.   Eye Contact:  Good  Speech:  Clear and Coherent and Normal Rate  Volume:  Normal  Mood:  Depressed  Affect:  Congruent  Thought Process:  Goal Directed, Linear and Descriptions of Associations: Intact  Orientation:  Full (Time, Place, and Person)  Thought Content:  Logical  Suicidal Thoughts:  Yes.  with intent/plan  Homicidal Thoughts:  No  Memory:  Immediate;   Good Recent;   Good Remote;   Good  Judgement:  Fair  Insight:  Fair  Psychomotor Activity:  Normal  Concentration:  Concentration: Good and Attention Span: Good  Recall:  Good  Fund of Knowledge:  Good  Language:  Good  Akathisia:  No  Handed:  Right  AIMS (if indicated):   N/A  Assets:  Communication Skills Desire for Improvement Housing  ADL's:  Intact  Cognition:  WNL  Sleep:   N/A   Assessment:  AJEENAH HEINY is a 52 y.o. female who was admitted with SI with a plan. She reports depression and worsening anxiety since August.  She denies HI or AVH. She warrants inpatient psychiatric hospitalization for stabilization and treatment.   Treatment Plan Summary: Daily contact with patient to assess and evaluate symptoms and progress in treatment and Medication management  -Admit to [facility].   Disposition: Recommend psychiatric Inpatient admission when medically cleared.  Cherly Beach, DO 02/06/2018 11:12 AM

## 2018-02-06 NOTE — Progress Notes (Signed)
D: Pt was in bed in her room upon initial approach.  Pt presents with depressed affect and mood.  She reports her day was "okay" and denies having a goal tonight.  Pt and writer made goal for pt to be safe and sleep well.  Pt reports "I don't feel good after I took that Clonidine.  I don't need to take that anymore."  Pt denies SI/HI, denies hallucinations, reports chronic back pain of 8/10.  Pt has isolative to her room for majority of the evening since she feels unsteady walking.  She was excused from evening group.    A: Introduced self to pt.  Met with pt 1:1.  Actively listened to pt and offered support and encouragement.  Medications administered per order.  PRN medication administered for sleep and pain.  Heat packs provided for pain.  Fall prevention techniques reviewed with pt and she verbalized understanding.  Q15 minute safety checks maintained.  R: Pt is safe on the unit.  Pt is compliant with medications.  Pt verbally contracts for safety.  Will continue to monitor and assess.

## 2018-02-06 NOTE — Progress Notes (Signed)
Admission note:  Patient is a 52 yo female [facility].  She states she was diagnosed with CHF 6-8 weeks ago.  She has been medically cleared for her chest pain.  She states she is depressed and "don't feel like doing anything."  Patient states she was going to school at [employer] and now is unable to do so because she cannot leave her house.  She reports crying spells, worrying, anxiety, insomnia and depression.  She states her triggers are financial. She is attempting to get disability.  She is also concerned about getting Medicaid insurance.  [place].  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  I couldn't do anything."  She started having thoughts of overdosing on her ambien.  Patient is on multiple medications, including oxycodone and klonopin. Patient has a hx of disk disease and osteoarthritis.  She has a daughter who is a good support for her, and she is listed as her emergency contact.  Patient states [facility].  She had a prior admission here over a year ago.  She was oriented to room and unit.
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
## 2018-02-06 NOTE — BH Assessment (Signed)
[facility]  Per Juanetta BeetsJacqueline Norman, DO, this pt requires psychiatric hospitalization at this time.  Berneice Heinrichina Tate, RN, [facility] Rm [facility].  Pt has signed Voluntary Admission and Consent for Treatment, as well as Consent to Release Information to no one, and signed forms have [facility].  Pt's nurse, Kendal Hymendie, has been notified, and agrees to send original paperwork along with pt via [facility], and to call report to [phone].  Doylene Canninghomas Yolanda Huffstetler, [place] Behavioral Health Coordinator [phone]

## 2018-02-06 NOTE — Progress Notes (Signed)
Confirmed with Henri Medalravis M., NP that patient is taking 15 mg oxcodone per [facility].  Informed Dr. Jama Flavorsobos of same.
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
## 2018-02-06 NOTE — Tx Team (Signed)
Initial Treatment Plan 02/06/2018 5:52 PM Brooke [name] HYQ:[id number]    PATIENT STRESSORS: Financial difficulties Health problems Occupational concerns Substance abuse   PATIENT STRENGTHS: Average or above average intelligence Communication skills Physical Health Supportive family/friends   PATIENT IDENTIFIED PROBLEMS: Polysubstance abuse  Depression  Anxiety "panic attacks"  Feeling worthless  "I need to get my insurance straightened out"  Suicidal thoughts of overdosing on ambien           DISCHARGE CRITERIA:  Improved stabilization in mood, thinking, and/or behavior Motivation to continue treatment in a less acute level of care Withdrawal symptoms are absent or subacute and managed without 24-hour nursing intervention  PRELIMINARY DISCHARGE PLAN: Attend aftercare/continuing care group Return to previous living arrangement Return to previous work or school arrangements  PATIENT/FAMILY INVOLVEMENT: This treatment plan has been presented to and reviewed with the patient, Brooke [name].  The patient and family have been given the opportunity to ask questions and make suggestions.  [name], [name], RN 02/06/2018, 5:52 PM
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
## 2018-02-07 DIAGNOSIS — F332 Major depressive disorder, recurrent severe without psychotic features: Principal | ICD-10-CM

## 2018-02-07 LAB — TSH: TSH: 3.235 u[IU]/mL (ref 0.350–4.500)

## 2018-02-07 LAB — URINE CULTURE: Culture: NO GROWTH

## 2018-02-07 MED ORDER — TRAZODONE HCL 100 MG PO TABS
100.0000 mg | ORAL_TABLET | Freq: Every evening | ORAL | Status: DC | PRN
  Administered 2018-02-07 – 2018-02-08 (×2): 100 mg via ORAL
  Filled 2018-02-07: qty 1

## 2018-02-07 NOTE — Plan of Care (Signed)
  Problem: Safety: Goal: Periods of time without injury will increase Outcome: Progressing Note:  Pt has not harmed self or others tonight.  She denies SI/HI and verbally contracts for safety.    

## 2018-02-07 NOTE — Progress Notes (Signed)
D: Pt was in dayroom upon initial approach.  Pt presents with anxious, depressed affect and mood.  She states "I'm okay" and reports her goal today is to "cooperate with treatment."  She states "I've been in bed not feeling well" and reports withdrawal symptoms of sweats, tremor, aches, agitation, anxiety.  Pt's hair is somewhat damp with sweat.  Pt denies SI/HI, denies hallucinations, reports chronic back pain of 8/10.  Pt has been visible in milieu interacting with peers and staff appropriately.  Pt attended evening group.    A: Introduced self to pt.  Met with pt 1:1.  Actively listened to pt and provided support and encouragement.  Medications administered per order.  PRN medication administered for withdrawal symptoms, sleep, and pain.  Heat packs provided for pain.  Fall prevention techniques reviewed with pt and she verbalized understanding.  Q15 minute safety checks maintained.  R: Pt is safe on the unit.  Pt is compliant with medications.  Pt verbally contracts for safety.  Will continue to monitor and assess.

## 2018-02-07 NOTE — Plan of Care (Signed)
D: Patient presents angry, depressed, irritable. 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 She has had worsening depression over the last couple days. She says she is "not yet" having Si. [facility]'s voice "in the mornings." She slept poorly last night, and received medication that was not helpful. Her appetite is fair, energy low and concentration poor. She rates her depression and hopelessness 1/10 and anxiety 9/10. She denies withdrawal symptoms. Patient denies HI.  A: Patient checked q15 min, and checks reviewed. Reviewed medication changes with patient and educated on side effects. Educated patient on importance of attending group therapy sessions and educated on several coping skills. Encouarged participation in milieu through recreation therapy and attending meals with peers. Support and encouragement provided. Fluids offered. R: Patient receptive to education on medications, and is medication compliant. Patient contracts for safety on the unit. Goal is to "feel better" and "cooperate."  Problem: Education: Goal: Emotional status will improve Outcome: Not Progressing Goal: Mental status will improve Outcome: Not Progressing   Problem: Activity: Goal: Interest or engagement in activities will improve Outcome: Not Progressing Goal: Sleeping patterns will improve Outcome: Not Progressing

## 2018-02-07 NOTE — Progress Notes (Signed)
[facility]  02/07/2018 8:48 AM [name]  MRN:  [id number]    Evaluation: Brooke [name] observed standing in the hall interacting with staff.  She is awake, alert and oriented x3.  Presents flat, guarded and depressed.  Reports chronic pain which often times causes her suicidal ideations.  States she feels like she may be detoxing from her pain medications as she reports her medications has been decreased during this admission. Brooke [name] is currently denies suicidal or homicidal ideations.  Rates her depression 8 out of 10 with 10 being the worst.  Patient reports she was recently initiated on Cymbalta for depression.  Patient reports feeling guilt related to be an inpatient as she reports today Thanksgiving and her daughter's birthday however knows that she needed to be treated to get better for herself and family.  Patient states that she has minimal rest on last night. Discussed increasing trazodone 50 mg 100 mg p.o. nightly patient was agreeable to treatment plan.  Support encouragement reassurance was provided.  History: Per assessments note:Patient is a 52 year old female, who presented to ED due to chest pain and dyspnea. She also reported depression and suicidal ideations, with thoughts of overdosing . She endorses intermittent auditory hallucinations, which she describes as distant whispers. Denies command hallucinations.She endorses neuro-vegetative symptoms as below, and also endorses chronic, significant anxiety/worry.Stressors include financial stressors, medical issues ( reports she was diagnosed with CHF a few months ago) .Patient reports she is currently prescribed Abilify, Cymbalta. These medications have been effective and well tolerated but has not been taking regularly recently .   Principal Problem: <principal problem not specified> Diagnosis: Active Problems:   MDD (major depressive disorder), severe (HCC)  Total Time spent with patient: 15 minutes  Past Psychiatric  History:  Past Medical History:  Past Medical History:  Diagnosis Date  . CHF (congestive heart failure) (HCC)   . DDD (degenerative disc disease), lumbar   . DDD (degenerative disc disease), lumbar   . Osteoarthritis     Past Surgical History:  Procedure Laterality Date  . BACK SURGERY    . knee replacement Left   . TOTAL KNEE ARTHROPLASTY     Family History:  Family History  Problem Relation Age of Onset  . Heart attack Mother   . Breast cancer Mother    Family Psychiatric  History:  Social History:  Social History   Substance and Sexual Activity  Alcohol Use Not Currently     Social History   Substance and Sexual Activity  Drug Use Never    Social History   Socioeconomic History  . Marital status: Single    Spouse name: Not on file  . Number of children: Not on file  . Years of education: Not on file  . Highest education level: Not on file  Occupational History  . Not on file  Social Needs  . Financial resource strain: Not on file  . Food insecurity:    Worry: Not on file    Inability: Not on file  . Transportation needs:    Medical: Not on file    Non-medical: Not on file  Tobacco Use  . Smoking status: Current Every Day Smoker    Packs/day: 2.00    Years: 29.00    Pack years: 58.00    Types: Cigarettes  . Smokeless tobacco: Never Used  Substance and Sexual Activity  . Alcohol use: Not Currently  . Drug use: Never  . Sexual activity: Not on file  Lifestyle  . Physical activity:    Days per week: Not on file    Minutes per session: Not on file  . Stress: Not on file  Relationships  . Social connections:    Talks on phone: Not on file    Gets together: Not on file    Attends religious service: Not on file    Active member of club or organization: Not on file    Attends meetings of clubs or organizations: Not on file    Relationship status: Not on file  Other Topics Concern  . Not on file  Social History Narrative  . Not on file    Additional Social History:                         Sleep: Fair  Appetite:  Fair  Current Medications: Current Facility-Administered Medications  Medication Dose Route Frequency Provider Last Rate Last Dose  . ARIPiprazole (ABILIFY) tablet 5 mg  5 mg Oral QHS Money, Travis B, FNP   5 mg at 02/06/18 2112  . atorvastatin (LIPITOR) tablet 40 mg  40 mg Oral q1800 Money, Feliz Beam B, FNP   40 mg at 02/06/18 1654  . cephALEXin (KEFLEX) capsule 500 mg  500 mg Oral Q8H Money, Travis B, FNP   500 mg at 02/07/18 1610  . DULoxetine (CYMBALTA) DR capsule 60 mg  60 mg Oral Daily Cobos, Rockey Situ, MD   60 mg at 02/07/18 0758  . gabapentin (NEURONTIN) capsule 100 mg  100 mg Oral BID Cobos, Rockey Situ, MD   100 mg at 02/07/18 0800  . hydrOXYzine (ATARAX/VISTARIL) tablet 25 mg  25 mg Oral Q6H PRN Cobos, Fernando A, MD      . loperamide (IMODIUM) capsule 2-4 mg  2-4 mg Oral PRN Cobos, Rockey Situ, MD      . LORazepam (ATIVAN) tablet 1 mg  1 mg Oral Q6H PRN Cobos, Rockey Situ, MD   1 mg at 02/07/18 0803  . multivitamin with minerals tablet 1 tablet  1 tablet Oral Daily Cobos, Rockey Situ, MD   1 tablet at 02/07/18 0758  . nicotine (NICODERM CQ - dosed in mg/24 hours) patch 21 mg  21 mg Transdermal Daily Cobos, Rockey Situ, MD   21 mg at 02/07/18 0759  . ondansetron (ZOFRAN-ODT) disintegrating tablet 4 mg  4 mg Oral Q6H PRN Cobos, Fernando A, MD      . oxyCODONE (Oxy IR/ROXICODONE) immediate release tablet 15 mg  15 mg Oral BID PRN Cobos, Rockey Situ, MD   15 mg at 02/06/18 2202  . pneumococcal 23 valent vaccine (PNU-IMMUNE) injection 0.5 mL  0.5 mL Intramuscular Tomorrow-1000 Cobos, Fernando A, MD      . thiamine (VITAMIN B-1) tablet 100 mg  100 mg Oral Daily Cobos, Rockey Situ, MD   100 mg at 02/07/18 0759  . traZODone (DESYREL) tablet 50 mg  50 mg Oral QHS PRN Cobos, Rockey Situ, MD   50 mg at 02/06/18 2111    Lab Results:  Results for orders placed or performed during the hospital encounter of  02/06/18 (from the past 48 hour(s))  TSH     Status: None   Collection Time: 02/07/18  6:32 AM  Result Value Ref Range   TSH 3.235 0.350 - 4.500 uIU/mL    Comment: Performed by a 3rd Generation assay with a functional sensitivity of <=0.01 uIU/mL. Performed at [facility], [address], [place], [place] [address]  Blood Alcohol level:  Lab Results  Component Value Date   ETH <10 02/05/2018   ETH  10/10/2006    <5        LOWEST DETECTABLE LIMIT FOR SERUM ALCOHOL IS 11 mg/dL FOR MEDICAL PURPOSES ONLY    Metabolic Disorder Labs: Lab Results  Component Value Date   HGBA1C 6.1 (H) 12/27/2017   MPG 128.37 12/27/2017   No results found for: PROLACTIN Lab Results  Component Value Date   CHOL 210 (H) 12/27/2017   TRIG 255 (H) 12/27/2017   HDL 38 (L) 12/27/2017   CHOLHDL 5.5 12/27/2017   VLDL 51 (H) 12/27/2017   LDLCALC 121 (H) 12/27/2017    Physical Findings: AIMS:  , ,  ,  ,    CIWA:  CIWA-Ar Total: 1 COWS:     Musculoskeletal: Strength & Muscle Tone: within normal limits Gait & Station: normal Patient leans: N/A  Psychiatric Specialty Exam: Physical Exam  Vitals reviewed. Cardiovascular: Normal rate.  Psychiatric: She has a normal mood and affect. Her behavior is normal.    Review of Systems  Psychiatric/Behavioral: Positive for depression, substance abuse and suicidal ideas. The patient is nervous/anxious.   All other systems reviewed and are negative.   Blood pressure 132/81, pulse 84, temperature 98.7 F (37.1 C), temperature source Oral, resp. rate 20, height 5\' 2"  (1.575 m), weight 76.2 kg.Body mass index is 30.73 kg/m.  General Appearance: Casual  Eye Contact:  Fair  Speech:  Clear and Coherent  Volume:  Normal  Mood:  Anxious and Depressed  Affect:  Congruent  Thought Process:  Coherent  Orientation:  Full (Time, Place, and Person)  Thought Content:  Logical  Suicidal Thoughts:  No  Homicidal Thoughts:  No  Memory:   Immediate;   Fair Recent;   Fair Remote;   Fair  Judgement:  Fair  Insight:  Fair  Psychomotor Activity:  Normal  Concentration:  Concentration: Fair  Recall:  [employer] of Knowledge:  Fair  Language:  Fair  Akathisia:  No  Handed:  Right  AIMS (if indicated):     Assets:  Communication Skills Desire for Improvement Resilience Social Support  ADL's:  Intact  Cognition:  WNL  Sleep:  Number of Hours: 6.5     Treatment Plan Summary: Daily contact with patient to assess and evaluate symptoms and progress in treatment and Medication management   Continue with current treatment plan on 02/07/2018 as listed below except were noted  Mood stabilization:   Continue Cymbalta 60 mg p.o. Daily  Continue Neurontin 100 mg p.o. twice daily  Continue Abilify 5 mg p.o. Daily  Insomnia:   Increase trazodone 50 mg to 100 mg p.o. Nightly   CSW will start working on disposition.  Patient to participate in therapeutic milieu  Oneta Rack, NP 02/07/2018, 8:48 AM

## 2018-02-08 LAB — HCG, QUANTITATIVE, PREGNANCY: hCG, Beta Chain, Quant, S: 5 m[IU]/mL — ABNORMAL HIGH (ref <5)

## 2018-02-08 MED ORDER — OXYCODONE HCL 5 MG PO TABS
15.0000 mg | ORAL_TABLET | Freq: Three times a day (TID) | ORAL | Status: DC | PRN
  Administered 2018-02-08 – 2018-02-13 (×15): 15 mg via ORAL
  Filled 2018-02-08 (×17): qty 3

## 2018-02-08 MED ORDER — GABAPENTIN 100 MG PO CAPS
200.0000 mg | ORAL_CAPSULE | Freq: Two times a day (BID) | ORAL | Status: DC
  Administered 2018-02-08 – 2018-02-09 (×2): 200 mg via ORAL
  Filled 2018-02-08 (×4): qty 2

## 2018-02-08 NOTE — Plan of Care (Signed)
  Problem: Activity: Goal: Interest or engagement in activities will improve Outcome: Progressing   Problem: Coping: Goal: Ability to verbalize frustrations and anger appropriately will improve Outcome: Progressing   D: Pt alert and oriented on the unit.  Pt denies A/VH, HI, but passive for SI. Pt's affect was flat and mood depressed. Pt did not participate during recreational activities today, but sat in the dayroom and watched television with other pts. Pt rated her depression a 3, feelings of hopelessness a 2, and anxiety an 8, all on a scale of 0 to 10, with 10 being the worst. Pt's goal for today is "to figure out why I can't function - feeling paralyzed." Pt is pleasant and cooperative on the unit.   A: Education, support and encouragement provided, q15 minute safety checks remain in effect. Medications administered per MD orders.  R: No reactions/side effects to medicine noted. Pt denies any concerns at this time, and verbally contracts for safety. Pt ambulating on the unit with no issues. Pt remains safe on and off the unit.
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
## 2018-02-08 NOTE — [facility] Group Notes (Signed)
Adult Psychoeducational Group Note  Date:  02/08/2018 Time:  2:36 AM  Group Topic/Focus:  Wrap-Up Group:   The focus of this group is to help patients review their daily goal of treatment and discuss progress on daily workbooks.  Participation Level:  Active  Participation Quality:  Appropriate and Attentive  Affect:  Appropriate  Cognitive:  Alert and Appropriate  Insight: Appropriate and Good  Engagement in Group:  Engaged  Modes of Intervention:  Discussion and Education  Additional Comments:  Pt attended and participated in wrap up group this evening. Pt eated their day a 2/10, due to them staying in bed and not feeling well. Pt goal while they are her is to function, which means them going back to school.   Chrisandra NettersOctavia A Yunuen Mordan 02/08/2018, 2:36 AM

## 2018-02-09 MED ORDER — LORAZEPAM 1 MG PO TABS
ORAL_TABLET | ORAL | Status: AC
  Administered 2018-02-09: 1 mg
  Filled 2018-02-09: qty 1

## 2018-02-09 MED ORDER — VITAMIN B-1 100 MG PO TABS
100.0000 mg | ORAL_TABLET | Freq: Every day | ORAL | Status: DC
  Administered 2018-02-10 – 2018-02-13 (×4): 100 mg via ORAL
  Filled 2018-02-09 (×6): qty 1

## 2018-02-09 MED ORDER — TRAZODONE HCL 50 MG PO TABS
50.0000 mg | ORAL_TABLET | Freq: Every evening | ORAL | Status: DC | PRN
  Administered 2018-02-09 – 2018-02-12 (×4): 50 mg via ORAL
  Filled 2018-02-09 (×3): qty 1
  Filled 2018-02-09: qty 7
  Filled 2018-02-09: qty 1

## 2018-02-09 MED ORDER — ADULT MULTIVITAMIN W/MINERALS CH
1.0000 | ORAL_TABLET | Freq: Every day | ORAL | Status: DC
  Administered 2018-02-09 – 2018-02-13 (×5): 1 via ORAL
  Filled 2018-02-09 (×6): qty 1

## 2018-02-09 MED ORDER — LORAZEPAM 1 MG PO TABS
1.0000 mg | ORAL_TABLET | Freq: Four times a day (QID) | ORAL | Status: AC | PRN
  Administered 2018-02-09 – 2018-02-11 (×4): 1 mg via ORAL
  Filled 2018-02-09 (×4): qty 1

## 2018-02-09 MED ORDER — GABAPENTIN 300 MG PO CAPS
300.0000 mg | ORAL_CAPSULE | Freq: Two times a day (BID) | ORAL | Status: DC
  Administered 2018-02-09 – 2018-02-13 (×8): 300 mg via ORAL
  Filled 2018-02-09 (×7): qty 1
  Filled 2018-02-09: qty 14
  Filled 2018-02-09: qty 1
  Filled 2018-02-09: qty 14
  Filled 2018-02-09 (×4): qty 1

## 2018-02-09 MED ORDER — DULOXETINE HCL 30 MG PO CPEP
90.0000 mg | ORAL_CAPSULE | Freq: Every day | ORAL | Status: DC
  Administered 2018-02-10 – 2018-02-13 (×4): 90 mg via ORAL
  Filled 2018-02-09: qty 3
  Filled 2018-02-09: qty 21
  Filled 2018-02-09 (×4): qty 3

## 2018-02-09 NOTE — Progress Notes (Signed)
Pt seen in the dayroom talking with peers and watching TV, but when she saw writer, she asked for her pain medication.  "I'm supposed to get my medicine at 7 o'clock."  Pt denies SI/HI/AVH at this time.  She makes her needs known to staff.  She voiced no other needs other than getting a sleep aid at bedtime which she was given.  Support and encouragement offered.  Discharge plans are in process.  Safety maintained with q15 minute checks.

## 2018-02-09 NOTE — [facility] Group Notes (Signed)
LCSW Group Therapy Note  02/09/2018    10:00-11:00am   Type of Therapy and Topic:  Group Therapy: Early Messages Received About Anger  Participation Level:  Minimal   Description of Group:   In this group, patients shared and discussed the early messages received in their lives about anger through parental or other adult modeling, teaching, repression, punishment, violence, and more.  Participants identified how those childhood lessons influence even now how they usually or often react when angered.  The group discussed that anger is a secondary emotion and what may be the underlying emotional themes that come out through anger outbursts or that are ignored through anger suppression.  Finally, as a group there was a conversation about the workbook's quote that "There is nothing wrong with anger; it is just a sign something needs to change."     Therapeutic Goals: 1. Patients will identify one or more childhood message about anger that they received and how it was taught to them. 2. Patients will discuss how these childhood experiences have influenced and continue to influence their own expression or repression of anger even today. 3. Patients will explore possible primary emotions that tend to fuel their secondary emotion of anger. 4. Patients will learn that anger itself is normal and cannot be eliminated, and that healthier coping skills can assist with resolving conflict rather than worsening situations.  Summary of Patient Progress:  The patient shared that her childhood lessons about anger were to internalize her feelings because she was an only child who was spoiled and would throw temper tantrums to get what she wanted.  As a result, she internalizes her feelings now and rarely expresses them outwardly.  She did not stay in group for the whole time.  Therapeutic Modalities:   Cognitive Behavioral Therapy Motivation Interviewing  Brooke [name]  .

## 2018-02-09 NOTE — Progress Notes (Signed)
D.  Pt pleasant on approach, complaint of continued chronic back pain (see pain flowsheet).  Pt was positive for evening wrap up group, observed engaged in appropriate interaction with peers on the unit.  Pt denies SI/HI/AVH at this time.  A.  Support and encouragement offered, medication given as ordered.  R.  Pt remains safe on the unit, will continue to monitor.

## 2018-02-09 NOTE — Progress Notes (Signed)
D Pt presents to the med window. SHe is anxious, dperessed and she says " I ndde my medicine now". Her affect is flat, dperessed and anxieous.      A SHe completed her daily assessment and on this she wrote she deneid SI today and she rated her depression, hopelessness and anxiety " 8/9/8", respectively. Her affect and and demeanor remain depressed . She was given a one time dose of po ativan, to see if it affected  her anxiety level at all ( she cannot decide,,,"maybe.... A little". ).      R Safety is in place.

## 2018-02-09 NOTE — [facility] Group Notes (Signed)
Adult Psychoeducational Group Note  Date:  02/09/2018 Time:  10:26 PM  Group Topic/Focus:  Wrap-Up Group:   The focus of this group is to help patients review their daily goal of treatment and discuss progress on daily workbooks.  Participation Level:  Active  Participation Quality:  Appropriate and Attentive  Affect:  Appropriate  Cognitive:  Alert and Appropriate  Insight: Appropriate and Good  Engagement in Group:  Engaged  Modes of Intervention:  Discussion and Education  Additional Comments:  Pt attended and participated in wrap up group this evening. Pt rated their day a 5/10, due to them opening up and talking more, which was also the pt goal.   Chrisandra NettersOctavia A Antaeus Karel 02/09/2018, 10:26 PM
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
## 2018-02-10 LAB — BASIC METABOLIC PANEL
ANION GAP: 9 (ref 5–15)
BUN: 13 mg/dL (ref 6–20)
CO2: 25 mmol/L (ref 22–32)
Calcium: 9.5 mg/dL (ref 8.9–10.3)
Chloride: 104 mmol/L (ref 98–111)
Creatinine, Ser: 0.86 mg/dL (ref 0.44–1.00)
GLUCOSE: 100 mg/dL — AB (ref 70–99)
POTASSIUM: 4.1 mmol/L (ref 3.5–5.1)
SODIUM: 138 mmol/L (ref 135–145)

## 2018-02-10 LAB — CBC WITH DIFFERENTIAL/PLATELET
ABS IMMATURE GRANULOCYTES: 0.05 10*3/uL (ref 0.00–0.07)
BASOS ABS: 0.1 10*3/uL (ref 0.0–0.1)
Basophils Relative: 1 %
EOS ABS: 0.3 10*3/uL (ref 0.0–0.5)
Eosinophils Relative: 3 %
HCT: 41.4 % (ref 36.0–46.0)
HEMOGLOBIN: 13 g/dL (ref 12.0–15.0)
IMMATURE GRANULOCYTES: 0 %
LYMPHS ABS: 4.8 10*3/uL — AB (ref 0.7–4.0)
LYMPHS PCT: 38 %
MCH: 29.6 pg (ref 26.0–34.0)
MCHC: 31.4 g/dL (ref 30.0–36.0)
MCV: 94.3 fL (ref 80.0–100.0)
MONOS PCT: 7 %
Monocytes Absolute: 0.9 10*3/uL (ref 0.1–1.0)
NEUTROS PCT: 51 %
NRBC: 0 % (ref 0.0–0.2)
Neutro Abs: 6.4 10*3/uL (ref 1.7–7.7)
Platelets: 326 10*3/uL (ref 150–400)
RBC: 4.39 MIL/uL (ref 3.87–5.11)
RDW: 13.2 % (ref 11.5–15.5)
WBC: 12.5 10*3/uL — ABNORMAL HIGH (ref 4.0–10.5)

## 2018-02-10 MED ORDER — MAGNESIUM CITRATE PO SOLN
1.0000 | Freq: Once | ORAL | Status: AC
  Administered 2018-02-10: 1 via ORAL

## 2018-02-10 MED ORDER — ONDANSETRON HCL 4 MG PO TABS
4.0000 mg | ORAL_TABLET | Freq: Once | ORAL | Status: AC
  Filled 2018-02-10: qty 1

## 2018-02-10 MED ORDER — ONDANSETRON 4 MG PO TBDP
ORAL_TABLET | ORAL | Status: AC
  Administered 2018-02-10: 4 mg via ORAL
  Filled 2018-02-10: qty 1

## 2018-02-10 NOTE — [facility] Suicide Risk Assessment (Signed)
[facility] INPATIENT:  Family/Significant Other Suicide Prevention Education  Suicide Prevention Education:  Contact Attempts: Arbie CookeyMichael Jochum has been identified by the patient as the family member/significant other with whom the patient will be residing, and identified as the person(s) who will aid the patient in the event of a mental health crisis.  With written consent from the patient, two attempts were made to provide suicide prevention education, prior to and/or following the patient's discharge.  We were unsuccessful in providing suicide prevention education.  A suicide education pamphlet was given to the patient to share with family/significant other.  Date and time of first attempt:02/10/18/1453 Date and time of second attempt:02/10/18/1605  Evorn GongRonnie D Yuriana Gaal 02/10/2018, 4:24 PM

## 2018-02-10 NOTE — [facility] Group Notes (Signed)
[facility] LCSW Group Therapy Note  02/10/2018  10:00-11:00AM  Type of Therapy and Topic:  Group Therapy:  Adding Supports Including Being Your Own Support  Participation Level:  Active   Description of Group:  Patients in this group were introduced to the concept that additional supports including self-support are an essential part of recovery.  A song entitled "I Need Help!" was played and a group discussion was held in reaction to the idea of needing to add supports.  A song entitled "My Own Hero" was played and a group discussion ensued in which patients stated they could relate to the song and it inspired them to realize they have be willing to help themselves in order to succeed, because other people cannot achieve sobriety or stability for them.  We discussed adding a variety of healthy supports to address the various needs in their lives.  A song was played called "I Know Where I've Been" toward the end of group and used to conduct an inspirational wrap-up to group of remembering how far they have already come in their journey.  Therapeutic Goals: 1)  demonstrate the importance of being a part of one's own support system 2)  discuss reasons people in one's life may eventually be unable to be continually supportive  3)  identify the patient's current support system and   4)  elicit commitments to add healthy supports and to become more conscious of being self-supportive   Summary of Patient Progress:  The patient expressed that her daughter and neighbors are healthy supports, checking on her all the time.  Her father is unhealthy for her, she stated, because he tells her that he does not want to be around depressed people, so when she talks to him on the phone, she tries to pretend to be happy.   Therapeutic Modalities:   Motivational Interviewing Activity  Lynnell ChadMareida J Grossman-Orr

## 2018-02-10 NOTE — [facility] Suicide Risk Assessment (Signed)
[facility] INPATIENT:  Family/Significant Other Suicide Prevention Education  Suicide Prevention Education:  Contact Attempts: neighbor/best friend Brooke [name] [phone], (name of family member/significant other) has been identified by the patient as the family member/significant other with whom the patient will be residing, and identified as the person(s) who will aid the patient in the event of a mental health crisis.  With written consent from the patient, two attempts were made to provide suicide prevention education, prior to and/or following the patient's discharge.  We were unsuccessful in providing suicide prevention education.  A suicide education pamphlet was given to the patient to share with family/significant other.  Date and time of first attempt: 02/10/18 at 2:52 PM Date and time of second attempt: to be completed  Shellia CleverlyStephanie N Haskell Rihn 02/10/2018, 2:53 PM
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
## 2018-02-10 NOTE — Plan of Care (Signed)
  Problem: Education: Goal: Verbalization of understanding the information provided will improve Outcome: Progressing   Problem: Health Behavior/Discharge Planning: Goal: Compliance with treatment plan for underlying cause of condition will improve Outcome: Progressing   

## 2018-02-10 NOTE — [facility] Group Notes (Signed)
Pt did not attend wrap up group this evening. Pt was in their room instead.  

## 2018-02-10 NOTE — Progress Notes (Signed)
D: Pt denies SI/HI/AVH. Pt is pleasant and cooperative. Pt stated she was feeling better, pt stated she was feeling depressed and anxious but overall doing better. Pt visible in the dayroom watching TV.   A: Pt was offered support and encouragement. Pt was given scheduled medications. Pt was encourage to attend groups. Q 15 minute checks were done for safety.  R: Pt is taking medication. Pt has no complaints.Pt receptive to treatment and safety maintained on unit.  Problem: Education: Goal: Emotional status will improve Outcome: Progressing   Problem: Education: Goal: Mental status will improve Outcome: Progressing   Problem: Activity: Goal: Sleeping patterns will improve Outcome: Progressing

## 2018-02-10 NOTE — Progress Notes (Signed)
[facility] Group Notes:  (Nursing/MHT/Case Management/Adjunct)  Date:  02/10/2018  Time:  1330 Type of Therapy:  Nurse Education with Patricia Duke, RN  Participation Level:  Active  Participation Quality:  Appropriate  Affect:  Appropriate  Cognitive:  Appropriate  Insight:  Appropriate  Engagement in Group:  Engaged  Modes of Intervention:  Discussion and Education     

## 2018-02-10 NOTE — Progress Notes (Signed)
Pt presents with a flat affect and depressed mood. Pt rated depression 5/10, anxiety 7/10 and hopelessness 5/10. Pt reported fair sleep at bedtime. Pt endorses passive SI with no plan or intent. Pt verbally contracts for safety. Pt appears to have several somatic complaints today. Pt c/o back pain and requested oxy, pt c/o increased anxiety and requested ativan, pt c/o constipation and requested mag citrate, and then c/o nausea and requested Zofran. Medications administered to pt as requested for complaints.  Medications administered as ordered per MD. Medications reviewed with pt. Verbal support provided. Pt encouraged to attend groups. 15 minute checks performed for safety.   Pt compliant with tx plan.

## 2018-02-11 NOTE — [facility] Group Notes (Signed)
[facility] Group Notes:  (Nursing/MHT/Case Management/Adjunct)  Date:  02/11/2018  Time:  4:00 PM Type of Therapy:  Nurse Education  Participation Level:  Active  Participation Quality:  Appropriate  Affect:  Appropriate  Cognitive:  Alert and Appropriate  Insight:  Appropriate and Improving  Engagement in Group:  Engaged and Improving  Modes of Intervention:  Discussion and Education  Summary of Progress/Problems: Patient was appropriate and attentive during RN group.  Montine Hight 02/11/2018, 6:09 PM 

## 2018-02-11 NOTE — Progress Notes (Signed)
Recreation Therapy Notes  Date: 12.2.19 Time: 0930 Location: 300 [address] Dayroom  Group Topic: Stress Management  Goal Area(s) Addresses:  Patient will verbalize importance of using healthy stress management.  Patient will identify positive emotions associated with healthy stress management.   Intervention: Stress Management  Activity :  Meditation.  LRT introduced the stress management technique of meditation.  LRT played a meditation dealing with impermanence.  Patients were to listen and follow along as the meditation played to engage in the technique.  Education:  Stress Management, Discharge Planning.   Education Outcome: Acknowledges edcuation/In group clarification offered/Needs additional education  Clinical Observations/Feedback: Pt did not attend group.    Caroll RancherMarjette Fatimah Sundquist, LRT/CTRS         Lillia AbedLindsay, Isauro Skelley A 02/11/2018 11:22 AM

## 2018-02-11 NOTE — Plan of Care (Addendum)
Patient was anxious and guarded upon approach this morning. Patient stated she thought she was going through more withdrawal symptoms including tremors, sweats, headache, and anxiety. PRN Ativan given. CIWA rated 11 out of 10. Patient claimed she slept well. Denies SI HI AVH. Denies any physical pain. Patient is compliant with medications prescribed per provider. No side effects noted. Safety is maintained with 15 minute checks. Will continue to monitor.  Problem: Education: Goal: Mental status will improve Outcome: Progressing   Problem: Activity: Goal: Sleeping patterns will improve Outcome: Progressing   Problem: Coping: Goal: Ability to demonstrate self-control will improve Outcome: Progressing   Problem: Education: Goal: Emotional status will improve Outcome: Not Progressing   Patient remains safe on the unit.

## 2018-02-11 NOTE — [facility] Group Notes (Signed)
LCSW Group Therapy Note 02/11/2018 12:31 PM  Type of Therapy and Topic: Group Therapy: Overcoming Obstacles  Participation Level: Did Not Attend  Description of Group:  In this group patients will be encouraged to explore what they see as obstacles to their own wellness and recovery. They will be guided to discuss their thoughts, feelings, and behaviors related to these obstacles. The group will process together ways to cope with barriers, with attention given to specific choices patients can make. Each patient will be challenged to identify changes they are motivated to make in order to overcome their obstacles. This group will be process-oriented, with patients participating in exploration of their own experiences as well as giving and receiving support and challenge from other group members.  Therapeutic Goals: 1. Patient will identify personal and current obstacles as they relate to admission. 2. Patient will identify barriers that currently interfere with their wellness or overcoming obstacles.  3. Patient will identify feelings, thought process and behaviors related to these barriers. 4. Patient will identify two changes they are willing to make to overcome these obstacles:   Summary of Patient Progress  Invited, chose not to attend.    Therapeutic Modalities:  Cognitive Behavioral Therapy Solution Focused Therapy Motivational Interviewing Relapse Prevention Therapy   Roena Sassaman LCSWA Clinical Social Worker   

## 2018-02-11 NOTE — [facility] Group Notes (Signed)
Adult Psychoeducational Group Note  Date:  02/11/2018 Time:  9:19 AM  Group Topic/Focus:  Goals Group:   The focus of this group is to help patients establish daily goals to achieve during treatment and discuss how the patient can incorporate goal setting into their daily lives to aide in recovery.  Participation Level:  Active  Participation Quality:  Appropriate  Affect:  Appropriate  Cognitive:  Alert  Insight: Appropriate  Engagement in Group:  Engaged  Modes of Intervention:  Discussion  Additional Comments:   Pt attended and participated in orientation/goals group facilitated by MHT Donnetta SimpersMichael A.   Amardeep Beckers M Maclane Holloran 02/11/2018, 9:19 AM

## 2018-02-11 NOTE — Tx Team (Signed)
Interdisciplinary Treatment and Diagnostic Plan Update  02/11/2018 Time of Session: 9:00am [name] MRN: [id number]  Principal Diagnosis: MDD (major depressive disorder), severe (HCC)  Secondary Diagnoses: Active Problems:   MDD (major depressive disorder), severe (HCC)   Current Medications:  Current Facility-Administered Medications  Medication Dose Route Frequency Provider Last Rate Last Dose  . ARIPiprazole (ABILIFY) tablet 5 mg  5 mg Oral QHS Money, Gerlene Burdock, FNP   5 mg at 02/10/18 2212  . atorvastatin (LIPITOR) tablet 40 mg  40 mg Oral q1800 Money, Feliz Beam B, FNP   40 mg at 02/10/18 1705  . cephALEXin (KEFLEX) capsule 500 mg  500 mg Oral Q8H Money, Travis B, FNP   500 mg at 02/11/18 6578  . DULoxetine (CYMBALTA) DR capsule 90 mg  90 mg Oral Daily Cobos, Rockey Situ, MD   90 mg at 02/11/18 4696  . gabapentin (NEURONTIN) capsule 300 mg  300 mg Oral BID Cobos, Rockey Situ, MD   300 mg at 02/11/18 2952  . LORazepam (ATIVAN) tablet 1 mg  1 mg Oral Q6H PRN Cobos, Rockey Situ, MD   1 mg at 02/11/18 0814  . multivitamin with minerals tablet 1 tablet  1 tablet Oral Daily Cobos, Rockey Situ, MD   1 tablet at 02/11/18 [phone]  . nicotine (NICODERM CQ - dosed in mg/24 hours) patch 21 mg  21 mg Transdermal Daily Cobos, Rockey Situ, MD   21 mg at 02/11/18 2440  . oxyCODONE (Oxy IR/ROXICODONE) immediate release tablet 15 mg  15 mg Oral TID PRN Cobos, Rockey Situ, MD   15 mg at 02/11/18 1027  . pneumococcal 23 valent vaccine (PNU-IMMUNE) injection 0.5 mL  0.5 mL Intramuscular Tomorrow-1000 Cobos, Brooke A, MD      . thiamine (VITAMIN B-1) tablet 100 mg  100 mg Oral Daily Cobos, Rockey Situ, MD   100 mg at 02/11/18 2536  . traZODone (DESYREL) tablet 50 mg  50 mg Oral QHS PRN Cobos, Rockey Situ, MD   50 mg at 02/10/18 2212   PTA Medications: Medications Prior to Admission  Medication Sig Dispense Refill Last Dose  . ARIPiprazole (ABILIFY) 5 MG tablet Take 1 tablet (5 mg total) by mouth at bedtime. For  mood control 30 tablet 0 02/04/2018 at Unknown time  . atorvastatin (LIPITOR) 40 MG tablet Take 1 tablet (40 mg total) by mouth daily at 6 PM. 30 tablet 0 Past Month at Unknown time  . busPIRone (BUSPAR) 10 MG tablet Take 1 tablet (10 mg total) by mouth 3 (three) times daily. For mood control 90 tablet 0 02/04/2018 at Unknown time  . clonazePAM (KLONOPIN) 0.5 MG tablet Take 1 tablet (0.5 mg total) by mouth at bedtime as needed (sleep). (Patient not taking: Reported on 02/05/2018) 7 tablet 0 Not Taking at Unknown time  . clonazePAM (KLONOPIN) 1 MG tablet Take 1 mg by mouth 2 (two) times daily.   02/05/2018 at Unknown time  . DULoxetine (CYMBALTA) 30 MG capsule Take 3 capsules (90 mg total) by mouth daily. For mood control (Patient taking differently: Take 30 mg by mouth 3 (three) times daily. For mood control) 90 capsule 0 02/04/2018 at Unknown time  . fluticasone (FLONASE) 50 MCG/ACT nasal spray Place 1 spray into both nostrils daily. 11.1 g 0 02/05/2018 at Unknown time  . gabapentin (NEURONTIN) 300 MG capsule Take 300 mg by mouth 2 (two) times daily.   02/05/2018 at Unknown time  . guaiFENesin (ROBITUSSIN) 100 MG/5ML liquid Take 200 mg by mouth  2 (two) times daily as needed for cough.    02/05/2018 at Unknown time  . Multiple Vitamins-Minerals (MULTIVITAMIN ADULTS 50+) TABS Take 1 tablet by mouth daily.   02/05/2018 at Unknown time  . oxyCODONE (ROXICODONE) 15 MG immediate release tablet Take 15 mg by mouth 3 (three) times daily as needed for pain.   02/04/2018 at Unknown time  . traZODone (DESYREL) 100 MG tablet Take 1 tablet (100 mg total) by mouth at bedtime as needed for sleep. (Patient taking differently: Take 100 mg by mouth at bedtime. ) 30 tablet 0 02/04/2018 at Unknown time    Patient Stressors: Financial difficulties Health problems Occupational concerns Substance abuse  Patient Strengths: Average or above average intelligence Communication skills Physical Health Supportive  family/friends  Treatment Modalities: Medication Management, Group therapy, Case management,  1 to 1 session with clinician, Psychoeducation, Recreational therapy.   Physician Treatment Plan for Primary Diagnosis: MDD (major depressive disorder), severe (HCC) Long Term Goal(s): Improvement in symptoms so as ready for discharge Improvement in symptoms so as ready for discharge   Short Term Goals: Ability to identify changes in lifestyle to reduce recurrence of condition will improve Ability to maintain clinical measurements within normal limits will improve Ability to identify changes in lifestyle to reduce recurrence of condition will improve Ability to verbalize feelings will improve Ability to disclose and discuss suicidal ideas Ability to demonstrate self-control will improve Ability to identify and develop effective coping behaviors will improve Ability to maintain clinical measurements within normal limits will improve  Medication Management: Evaluate patient's response, side effects, and tolerance of medication regimen.  Therapeutic Interventions: 1 to 1 sessions, Unit Group sessions and Medication administration.  Evaluation of Outcomes: Progressing  Physician Treatment Plan for Secondary Diagnosis: Active Problems:   MDD (major depressive disorder), severe (HCC)  Long Term Goal(s): Improvement in symptoms so as ready for discharge Improvement in symptoms so as ready for discharge   Short Term Goals: Ability to identify changes in lifestyle to reduce recurrence of condition will improve Ability to maintain clinical measurements within normal limits will improve Ability to identify changes in lifestyle to reduce recurrence of condition will improve Ability to verbalize feelings will improve Ability to disclose and discuss suicidal ideas Ability to demonstrate self-control will improve Ability to identify and develop effective coping behaviors will improve Ability to  maintain clinical measurements within normal limits will improve     Medication Management: Evaluate patient's response, side effects, and tolerance of medication regimen.  Therapeutic Interventions: 1 to 1 sessions, Unit Group sessions and Medication administration.  Evaluation of Outcomes: Progressing   RN Treatment Plan for Primary Diagnosis: <principal problem not specified> Long Term Goal(s): Knowledge of disease and therapeutic regimen to maintain health will improve  Short Term Goals: Ability to remain free from injury will improve, Ability to verbalize frustration and anger appropriately will improve, Ability to demonstrate self-control, Ability to verbalize feelings will improve, Ability to disclose and discuss suicidal ideas and Compliance with prescribed medications will improve  Medication Management: RN will administer medications as ordered by provider, will assess and evaluate patient's response and provide education to patient for prescribed medication. RN will report any adverse and/or side effects to prescribing provider.  Therapeutic Interventions: 1 on 1 counseling sessions, Psychoeducation, Medication administration, Evaluate responses to treatment, Monitor vital signs and CBGs as ordered, Perform/monitor CIWA, COWS, AIMS and Fall Risk screenings as ordered, Perform wound care treatments as ordered.  Evaluation of Outcomes: Progressing   LCSW Treatment Plan  for Primary Diagnosis: MDD (major depressive disorder), severe (HCC) Long Term Goal(s): Safe transition to appropriate next level of care at discharge, Engage patient in therapeutic group addressing interpersonal concerns.  Short Term Goals: Engage patient in aftercare planning with referrals and resources, Increase social support, Increase ability to appropriately verbalize feelings, Increase emotional regulation and Increase skills for wellness and recovery  Therapeutic Interventions: Assess for all discharge  needs, 1 to 1 time with Social worker, Explore available resources and support systems, Assess for adequacy in community support network, Educate family and significant other(s) on suicide prevention, Complete Psychosocial Assessment, Interpersonal group therapy.  Evaluation of Outcomes: Progressing   Progress in Treatment: Attending groups: Yes. Participating in groups: Yes. Taking medication as prescribed: Yes. Toleration medication: Yes. Family/Significant other contact made: Yes, individual(s) contacted:  2 attempts to reach neighbor, Arbie CookeyMichael Jochum, will complete SPE with patient Patient understands diagnosis: Yes. Discussing patient identified problems/goals with staff: Yes. Medical problems stabilized or resolved: Yes. Denies suicidal/homicidal ideation: Yes. Issues/concerns per patient self-inventory: Yes.  New problem(s) identified: No, Describe:  CSW continuing to assess  New Short Term/Long Term Goal(s): medication management for mood stabilization; elimination of SI thoughts; development of comprehensive mental wellness/sobriety plan.  Patient Goals:  "Be able to function again." Patient shared that her unmanaged depression impacts her ability to work or attend school.  Discharge Plan or Barriers: Patient expected to discharge home with outpatient follow up. [facility] pamphlet, Mobile Crisis information, and information provided to patient for additional community support and resources.   Reason for Continuation of Hospitalization: Anxiety Depression Medication stabilization Suicidal ideation  Estimated Length of Stay: 3-5 days  Attendees: Patient: Brooke [name] 02/11/2018 9:21 AM  Physician: Javier Dockerr.Cobos 02/11/2018 9:21 AM  Nursing: Arlyss RepressAlyssa 02/11/2018 9:21 AM  RN Care Manager: 02/11/2018 9:21 AM  Social Worker: Enid Cutterharlotte Talena Neira, CSW 02/11/2018 9:21 AM  Recreational Therapist:  02/11/2018 9:21 AM  Other:  02/11/2018 9:21 AM  Other:  02/11/2018 9:21 AM  Other: 02/11/2018 9:21 AM     Scribe for Treatment Team: Darreld Mcleanharlotte C Gizzelle Lacomb, LCSWA 02/11/2018 9:21 AM
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
## 2018-02-12 MED ORDER — HYDROXYZINE HCL 25 MG PO TABS
25.0000 mg | ORAL_TABLET | Freq: Three times a day (TID) | ORAL | Status: DC | PRN
  Administered 2018-02-12: 25 mg via ORAL
  Filled 2018-02-12: qty 10
  Filled 2018-02-12: qty 1

## 2018-02-12 MED ORDER — BISACODYL 10 MG RE SUPP
10.0000 mg | Freq: Every day | RECTAL | Status: DC | PRN
  Administered 2018-02-12: 10 mg via RECTAL
  Filled 2018-02-12: qty 1

## 2018-02-12 NOTE — Progress Notes (Signed)
D: Pt denies SI/HI/AVH. Pt is pleasant and cooperative. Pt stated she was feeling nervous because she needs money for her rent. Pt was going to have to ask her father for the money. Pt given PRN vistaril per MAR to help with her anxiety  A: Pt was offered support and encouragement. Pt was given scheduled medications. Pt was encourage to attend groups. Q 15 minute checks were done for safety.   R:Pt attends groups and interacts well with peers and staff. Pt is taking medication. Pt receptive to treatment and safety maintained on unit.   Problem: Activity: Goal: Interest or engagement in activities will improve Outcome: Progressing   Problem: Activity: Goal: Sleeping patterns will improve Outcome: Progressing

## 2018-02-12 NOTE — Plan of Care (Signed)
Patient self inventory- Patient slept poorly last night, sleep medication was requested and was not helpful. Appetite has been fair, energy level low, concentration good. Depression, hopelessness, and anxiety rated 5, 7, 7. Endorses passive SI with no specific plan. Pain rated 6/10 in her back. Patient's goal is to "talk to social worker/financial counselor." Patient is complaining she hasn't had a bowel movement in 5 days. Patient verbally contracts for safety while on the unit. Patient is compliant with medications prescribed per provider. No side effects noted. Safety is maintained with 15 minute checks as well as environmental checks. Will continue to monitor.  Problem: Activity: Goal: Interest or engagement in activities will improve Outcome: Progressing Goal: Sleeping patterns will improve Outcome: Progressing   Problem: Coping: Goal: Ability to verbalize frustrations and anger appropriately will improve Outcome: Progressing Goal: Ability to demonstrate self-control will improve Outcome: Progressing   Problem: Safety: Goal: Periods of time without injury will increase Outcome: Progressing

## 2018-02-12 NOTE — [facility] Group Notes (Signed)
LCSW Group Therapy Note 02/12/2018 1:09 PM  Type of Therapy/Topic: Group Therapy: Feelings about Diagnosis  Participation Level: Did Not Attend   Description of Group:  This group will allow patients to explore their thoughts and feelings about diagnoses they have received. Patients will be guided to explore their level of understanding and acceptance of these diagnoses. Facilitator will encourage patients to process their thoughts and feelings about the reactions of others to their diagnosis and will guide patients in identifying ways to discuss their diagnosis with significant others in their lives. This group will be process-oriented, with patients participating in exploration of their own experiences, giving and receiving support, and processing challenge from other group members.  Therapeutic Goals: 1. Patient will demonstrate understanding of diagnosis as evidenced by identifying two or more symptoms of the disorder 2. Patient will be able to express two feelings regarding the diagnosis 3. Patient will demonstrate their ability to communicate their needs through discussion and/or role play  Summary of Patient Progress:  Invited, chose not to attend.     Therapeutic Modalities:  Cognitive Behavioral Therapy Brief Therapy Feelings Identification    Nevah Dalal LCSWA Clinical Social Worker   

## 2018-02-12 NOTE — [facility] Group Notes (Signed)
Adult Psychoeducational Group Note  Date:  02/12/2018 Time:  5:43 AM  Group Topic/Focus:  Wrap-Up Group:   The focus of this group is to help patients review their daily goal of treatment and discuss progress on daily workbooks.  Participation Level:  Active  Participation Quality:  Appropriate and Attentive  Affect:  Appropriate  Cognitive:  Alert and Appropriate  Insight: Appropriate and Good  Engagement in Group:  Engaged  Modes of Intervention:  Discussion and Education  Additional Comments:  Pt attended and participated in wrap up group this evening. Pt rated their day an 8/10, due to them talking to their daughter and their grandchildren. Pt somewhat completed their goal, which was to not isolate themselves. Pt went to some groups, but medicine is making them tired, which keeps them in bed a lot.     Brooke NettersOctavia A Sela [name] 02/12/2018, 5:43 AM

## 2018-02-12 NOTE — Progress Notes (Signed)
Recreation Therapy Notes  Animal-Assisted Activity (AAA) Program Checklist/Progress Notes Patient Eligibility Criteria Checklist & Daily Group note for Rec Tx Intervention  Date: 12.3.19 Time: 1430 [address]   AAA/T Program Assumption of Risk Form signed by Engineer, productionatient/ or Parent Legal Guardian  YES   Patient is free of allergies or sever asthma  YES   Patient reports no fear of animals  YES   Patient reports no history of cruelty to animals YES   Patient understands his/her participation is voluntary YES   Patient washes hands before animal contact  YES   Patient washes hands after animal contact  YES   Behavioral Response: Engaged  Education: Charity fundraiserHand Washing, Appropriate Animal Interaction   Education Outcome: Acknowledges understanding/In group clarification offered/Needs additional education.   Clinical Observations/Feedback: Pt attended and participated in activity.    Caroll RancherMarjette Clarabell Matsuoka, LRT/CTRS         Caroll RancherLindsay, Jezebelle Ledwell A 02/12/2018 3:44 PM

## 2018-02-12 NOTE — Progress Notes (Signed)
[facility] MD Progress Note  02/12/2018 11:39 AM SHAQUELLA STAMANT  MRN:  [id number] Subjective: Patient is seen and examined.  Patient is a 52 year old female with a past psychiatric history significant for major depression, persistent anxiety and suicidal ideation.  She was admitted on 11/27 with suicidal ideation and consideration of overdosing.  Directive: Patient is seen and examined.  Patient is a 52 year old female with the above-stated past psychiatric history who is seen in follow-up.  She stated she is doing better.  She denied any suicidal ideation.  She still feels like she has benzodiazepine cravings, but no physical symptoms of withdrawal.  She continues on Abilify, Cymbalta, gabapentin and trazodone.  She denied any side effects to her current medications.  We discussed the possibility of discharge tomorrow.  She is concerned about having her oxycodone after discharge.  Her vital signs are stable, she is afebrile.  Her pulse is 79.  Nursing notes reflect that she slept 6.5 hours last night.  Principal Problem: <principal problem not specified> Diagnosis: Active Problems:   MDD (major depressive disorder), severe (HCC)  Total Time spent with patient: 20 minutes  Past Psychiatric History: See admission H&P  Past Medical History:  Past Medical History:  Diagnosis Date  . CHF (congestive heart failure) (HCC)   . DDD (degenerative disc disease), lumbar   . DDD (degenerative disc disease), lumbar   . Osteoarthritis     Past Surgical History:  Procedure Laterality Date  . BACK SURGERY    . knee replacement Left   . TOTAL KNEE ARTHROPLASTY     Family History:  Family History  Problem Relation Age of Onset  . Heart attack Mother   . Breast cancer Mother    Family Psychiatric  History: See admission H&P Social History:  Social History   Substance and Sexual Activity  Alcohol Use Not Currently     Social History   Substance and Sexual Activity  Drug Use Never    Social History    Socioeconomic History  . Marital status: Single    Spouse name: Not on file  . Number of children: Not on file  . Years of education: Not on file  . Highest education level: Not on file  Occupational History  . Not on file  Social Needs  . Financial resource strain: Not on file  . Food insecurity:    Worry: Not on file    Inability: Not on file  . Transportation needs:    Medical: Not on file    Non-medical: Not on file  Tobacco Use  . Smoking status: Current Every Day Smoker    Packs/day: 2.00    Years: 29.00    Pack years: 58.00    Types: Cigarettes  . Smokeless tobacco: Never Used  Substance and Sexual Activity  . Alcohol use: Not Currently  . Drug use: Never  . Sexual activity: Not on file  Lifestyle  . Physical activity:    Days per week: Not on file    Minutes per session: Not on file  . Stress: Not on file  Relationships  . Social connections:    Talks on phone: Not on file    Gets together: Not on file    Attends religious service: Not on file    Active member of club or organization: Not on file    Attends meetings of clubs or organizations: Not on file    Relationship status: Not on file  Other Topics Concern  . Not on  file  Social History Narrative  . Not on file   Additional Social History:                         Sleep: Good  Appetite:  Good  Current Medications: Current Facility-Administered Medications  Medication Dose Route Frequency Provider Last Rate Last Dose  . ARIPiprazole (ABILIFY) tablet 5 mg  5 mg Oral QHS Money, Travis B, FNP   5 mg at 02/11/18 2220  . atorvastatin (LIPITOR) tablet 40 mg  40 mg Oral q1800 Money, Gerlene Burdockravis B, FNP   40 mg at 02/11/18 1729  . bisacodyl (DULCOLAX) suppository 10 mg  10 mg Rectal Daily PRN Antonieta Pertlary, Krystn Dermody Lawson, MD      . DULoxetine (CYMBALTA) DR capsule 90 mg  90 mg Oral Daily Cobos, Rockey SituFernando A, MD   90 mg at 02/12/18 0747  . gabapentin (NEURONTIN) capsule 300 mg  300 mg Oral BID Cobos, Rockey SituFernando  A, MD   300 mg at 02/12/18 0747  . multivitamin with minerals tablet 1 tablet  1 tablet Oral Daily Cobos, Rockey SituFernando A, MD   1 tablet at 02/12/18 0747  . nicotine (NICODERM CQ - dosed in mg/24 hours) patch 21 mg  21 mg Transdermal Daily Cobos, Rockey SituFernando A, MD   21 mg at 02/12/18 0745  . oxyCODONE (Oxy IR/ROXICODONE) immediate release tablet 15 mg  15 mg Oral TID PRN Cobos, Rockey SituFernando A, MD   15 mg at 02/12/18 0636  . pneumococcal 23 valent vaccine (PNU-IMMUNE) injection 0.5 mL  0.5 mL Intramuscular Tomorrow-1000 Cobos, Fernando A, MD      . thiamine (VITAMIN B-1) tablet 100 mg  100 mg Oral Daily Cobos, Rockey SituFernando A, MD   100 mg at 02/12/18 0747  . traZODone (DESYREL) tablet 50 mg  50 mg Oral QHS PRN Cobos, Rockey SituFernando A, MD   50 mg at 02/11/18 2220    Lab Results: No results found for this or any previous visit (from the past 48 hour(s)).  Blood Alcohol level:  Lab Results  Component Value Date   ETH <10 02/05/2018   ETH  10/10/2006    <5        LOWEST DETECTABLE LIMIT FOR SERUM ALCOHOL IS 11 mg/dL FOR MEDICAL PURPOSES ONLY    Metabolic Disorder Labs: Lab Results  Component Value Date   HGBA1C 6.1 (H) 12/27/2017   MPG 128.37 12/27/2017   No results found for: PROLACTIN Lab Results  Component Value Date   CHOL 210 (H) 12/27/2017   TRIG 255 (H) 12/27/2017   HDL 38 (L) 12/27/2017   CHOLHDL 5.5 12/27/2017   VLDL 51 (H) 12/27/2017   LDLCALC 121 (H) 12/27/2017    Physical Findings: AIMS: Facial and Oral Movements Muscles of Facial Expression: None, normal Lips and Perioral Area: None, normal Jaw: None, normal Tongue: None, normal,Extremity Movements Upper (arms, wrists, hands, fingers): None, normal Lower (legs, knees, ankles, toes): None, normal, Trunk Movements Neck, shoulders, hips: None, normal, Overall Severity Severity of abnormal movements (highest score from questions above): None, normal Incapacitation due to abnormal movements: None, normal Patient's awareness of abnormal  movements (rate only patient's report): No Awareness, Dental Status Current problems with teeth and/or dentures?: No Does patient usually wear dentures?: No  CIWA:  CIWA-Ar Total: 11 COWS:  COWS Total Score: 0  Musculoskeletal: Strength & Muscle Tone: within normal limits Gait & Station: normal Patient leans: N/A  Psychiatric Specialty Exam: Physical Exam  Nursing note and vitals reviewed. Constitutional:  She is oriented to person, place, and time. She appears well-developed and well-nourished.  HENT:  Head: Normocephalic and atraumatic.  Respiratory: Effort normal.  Neurological: She is alert and oriented to person, place, and time.    ROS  Blood pressure 110/68, pulse 79, temperature 98.6 F (37 C), temperature source Oral, resp. rate 20, height 5\' 2"  (1.575 m), weight 76.2 kg.Body mass index is 30.73 kg/m.  General Appearance: Casual  Eye Contact:  Fair  Speech:  Normal Rate  Volume:  Normal  Mood:  Anxious  Affect:  Congruent  Thought Process:  Coherent and Descriptions of Associations: Intact  Orientation:  Full (Time, Place, and Person)  Thought Content:  Logical  Suicidal Thoughts:  No  Homicidal Thoughts:  No  Memory:  Immediate;   Fair Recent;   Fair Remote;   Fair  Judgement:  Fair  Insight:  Fair  Psychomotor Activity:  Normal  Concentration:  Concentration: Fair and Attention Span: Fair  Recall:  [employer] of Knowledge:  Fair  Language:  Fair  Akathisia:  Negative  Handed:  Right  AIMS (if indicated):     Assets:  Communication Skills Desire for Improvement Housing Resilience Social Support  ADL's:  Intact  Cognition:  WNL  Sleep:  Number of Hours: 6.5     Treatment Plan Summary: Daily contact with patient to assess and evaluate symptoms and progress in treatment, Medication management and Plan : Patient is seen and examined.  Patient is a 52 year old female with a past psychiatric history significant for major depression and generalized  anxiety disorder.  She also has a history of congestive heart failure and chronic pain.  She continues to improve.  No evidence of physical withdrawal of benzodiazepines.  No suicidal ideation.  She denied any side effects to her current medications.  Her only complaint today she is constipated, and we will try and use a Dulcolax suppository to resolve that. 1.  Continue Cymbalta 90 mg p.o. daily for depression and anxiety. 2. Continue Abilify 5 mg p.o. daily for augmentation for depression and psychosis. 3. Continue Neurontin 300 mg p.o. twice daily for anxiety and pain. 4. Continue oxycodone 15 mg p.o. 3 times daily as needed pain. 5. Continue trazodone 50 mg p.o. nightly as needed for insomnia. 6. We will stop Ativan PRN's today. 7.  Dulcolax suppository for constipation as needed daily. 8.  Plan discharge tomorrow if all goes well.   Antonieta Pert, MD 02/12/2018, 11:39 AM

## 2018-02-12 NOTE — Progress Notes (Signed)
The patient shared in group that her day was "not real great". Her goal for tomorrow is to get discharged.

## 2018-02-13 MED ORDER — ATORVASTATIN CALCIUM 40 MG PO TABS: 40.0000 mg | ORAL_TABLET | Freq: Every day | ORAL | 0 refills | Status: AC

## 2018-02-13 MED ORDER — ARIPIPRAZOLE 5 MG PO TABS: 5.0000 mg | ORAL_TABLET | Freq: Every day | ORAL | 0 refills | Status: AC

## 2018-02-13 MED ORDER — OXYCODONE HCL 15 MG PO TABS: 15.0000 mg | ORAL_TABLET | Freq: Three times a day (TID) | ORAL | 0 refills | Status: AC | PRN

## 2018-02-13 MED ORDER — GABAPENTIN 300 MG PO CAPS: 300.0000 mg | ORAL_CAPSULE | Freq: Two times a day (BID) | ORAL | 0 refills | Status: AC

## 2018-02-13 MED ORDER — DULOXETINE HCL 30 MG PO CPEP: 90.0000 mg | ORAL_CAPSULE | Freq: Every day | ORAL | 0 refills | Status: AC

## 2018-02-13 MED ORDER — FLUTICASONE PROPIONATE 50 MCG/ACT NA SUSP: 1.0000 | Freq: Every day | NASAL | 0 refills | Status: AC

## 2018-02-13 MED ORDER — TRAZODONE HCL 50 MG PO TABS: 50.0000 mg | ORAL_TABLET | Freq: Every evening | ORAL | 0 refills | Status: AC | PRN

## 2018-02-13 MED ORDER — NICOTINE 21 MG/24HR TD PT24: 21.0000 mg | MEDICATED_PATCH | Freq: Every day | TRANSDERMAL | 0 refills | Status: AC

## 2018-02-13 MED ORDER — HYDROXYZINE HCL 25 MG PO TABS: 25.0000 mg | ORAL_TABLET | Freq: Three times a day (TID) | ORAL | 0 refills | Status: AC | PRN

## 2018-02-13 MED ORDER — THIAMINE HCL 100 MG PO TABS: 100.0000 mg | ORAL_TABLET | Freq: Every day | ORAL | 0 refills | Status: AC

## 2018-02-13 NOTE — [facility] Group Notes (Signed)
[facility]      02/13/2018 11:55 AM  Type of Therapy: Mental Health Association Presentation  Participation Level: Active  Participation Quality: Attentive  Affect: Appropriate  Cognitive: Oriented  Insight: Developing/Improving  Engagement in Therapy: Engaged  Modes of Intervention: Discussion, Education and Socialization  Summary of Progress/Problems: Mental Health Association (MHA) Speaker came to talk about his personal journey with mental health. The pt processed ways by which to relate to the speaker. MHA speaker provided handouts and educational information pertaining to groups and services offered by the [facility]. Pt was engaged in speaker's presentation and was receptive to resources provided.    Alcario DroughtJolan Laurrie Toppin LCSWA Clinical Social Worker

## 2018-02-13 NOTE — [facility] Suicide Risk Assessment (Signed)
[facility]   Principal Problem: <principal problem not specified> Discharge Diagnoses: Active Problems:   MDD (major depressive disorder), severe (HCC)   Total Time spent with patient: 30 minutes  Musculoskeletal: Strength & Muscle Tone: within normal limits Gait & Station: normal Patient leans: N/A  Psychiatric Specialty Exam: Review of Systems  All other systems reviewed and are negative.   Blood pressure 106/69, pulse 71, temperature 98.8 F (37.1 C), temperature source Oral, resp. rate 20, height 5\' 2"  (1.575 m), weight 76.2 kg.Body mass index is 30.73 kg/m.  General Appearance: Casual  Eye Contact::  Fair  Speech:  Normal Rate409  Volume:  Normal  Mood:  Euthymic  Affect:  Congruent  Thought Process:  Coherent and Descriptions of Associations: Intact  Orientation:  Full (Time, Place, and Person)  Thought Content:  Logical  Suicidal Thoughts:  No  Homicidal Thoughts:  No  Memory:  Immediate;   Fair Recent;   Fair Remote;   Fair  Judgement:  Intact  Insight:  Fair  Psychomotor Activity:  Normal  Concentration:  Good  Recall:  Good  Fund of Knowledge:Good  Language: Good  Akathisia:  Negative  Handed:  Right  AIMS (if indicated):     Assets:  Desire for Improvement Housing Physical Health Resilience Social Support  Sleep:  Number of Hours: 6.5  Cognition: WNL  ADL's:  Intact   Mental Status Per Nursing Assessment::   On Admission:  Suicidal ideation indicated by patient, Suicide plan, Self-harm thoughts, Intention to act on suicide plan  Demographic Factors:  Divorced or widowed, Caucasian and Unemployed  Loss Factors: NA  Historical Factors: Impulsivity  Risk Reduction Factors:   NA  Continued Clinical Symptoms:  Depression:   Impulsivity Chronic Pain  Cognitive Features That Contribute To Risk:  None    Suicide Risk:  Minimal: No identifiable suicidal ideation.  Patients presenting with no risk factors but  with morbid ruminations; may be classified as minimal risk based on the severity of the depressive symptoms  Follow-up Information    [facility].   Why:  Please follow up for therapy and medication management during the walk-in hours, Monday-Friday 8:30a-12:00p and 2:00p-3:30p.  Contact information: [address] [place] [address] Phone: [phone] Fax:          Plan Of Care/Follow-up recommendations:  Activity:  ad lib  Antonieta PertGreg Lawson Clary, MD 02/13/2018, 7:58 AM

## 2018-02-13 NOTE — Discharge Summary (Signed)
Physician Discharge Summary Note  Patient:  Brooke [name] is an 52 y.o., female  MRN:  [id number]  DOB:  10-02-1965  Patient phone:  [phone] (home)   Patient address:   [address] [place] [address],   Total Time spent with patient: Greater than 30 minutes  Date of Admission:  02/06/2018  Date of Discharge: 02/13/18  Reason for Admission: Reported depression and suicidal ideations with thoughts of overdosing.  Principal Problem: MDD (major depressive disorder), recurrent severe, without psychosis [facility]  Discharge Diagnoses: Patient Active Problem List   Diagnosis Date Noted  . MDD (major depressive disorder), recurrent severe, without psychosis (HCC) [F33.2]     Priority: High  . MDD (major depressive disorder), severe (HCC) [F32.2] 02/06/2018  . Tobacco abuse [Z72.0] 12/27/2017  . Leukocytosis [D72.829] 12/27/2017  . Chest pain [R07.9] 12/26/2017  . Dyspnea on exertion [R06.09] 12/18/2017  . Atypical chest pain [R07.89] 12/18/2017  . Generalized anxiety disorder [F41.1]   . Chronic pain syndrome [G89.4]   . MDD (major depressive disorder) [F32.9] 06/04/2017  . Suicidal ideation [R45.851] 06/02/2017  . Dehydration [E86.0] 05/30/2017   Past Psychiatric History: Major depression, substance use disorders.  Past Medical History:  Past Medical History:  Diagnosis Date  . CHF (congestive heart failure) (HCC)   . DDD (degenerative disc disease), lumbar   . DDD (degenerative disc disease), lumbar   . Osteoarthritis     Past Surgical History:  Procedure Laterality Date  . BACK SURGERY    . knee replacement Left   . TOTAL KNEE ARTHROPLASTY     Family History:  Family History  Problem Relation Age of Onset  . Heart attack Mother   . Breast cancer Mother    Family Psychiatric  History: See H&P.  Social History:  Social History   Substance and Sexual Activity  Alcohol Use Not Currently     Social History   Substance and Sexual Activity   Drug Use Never    Social History   Socioeconomic History  . Marital status: Single    Spouse name: Not on file  . Number of children: Not on file  . Years of education: Not on file  . Highest education level: Not on file  Occupational History  . Not on file  Social Needs  . Financial resource strain: Not on file  . Food insecurity:    Worry: Not on file    Inability: Not on file  . Transportation needs:    Medical: Not on file    Non-medical: Not on file  Tobacco Use  . Smoking status: Current Every Day Smoker    Packs/day: 2.00    Years: 29.00    Pack years: 58.00    Types: Cigarettes  . Smokeless tobacco: Never Used  Substance and Sexual Activity  . Alcohol use: Not Currently  . Drug use: Never  . Sexual activity: Not on file  Lifestyle  . Physical activity:    Days per week: Not on file    Minutes per session: Not on file  . Stress: Not on file  Relationships  . Social connections:    Talks on phone: Not on file    Gets together: Not on file    Attends religious service: Not on file    Active member of club or organization: Not on file    Attends meetings of clubs or organizations: Not on file    Relationship status: Not on file  Other Topics Concern  .  Not on file  Social History Narrative  . Not on file   Hospital Course: (Per Md's admission evaluation): Patient is a 52 year old female, who presented to ED due to chest pain and dyspnea. She also reported depression and suicidal ideations, with thoughts of overdosing . She endorses intermittent auditory hallucinations, which she describes as distant whispers. Denies command hallucinations. She endorses neuro-vegetative symptoms as below, and also endorses chronic, significant anxiety/worry. Stressors include financial stressors, medical issues (reports she was diagnosed with CHF a few months ago). Patient reports she is currently prescribed Abilify, Cymbalta. These medications have been effective and well  tolerated but has not been taking regularly recently.  After the above admission notes, [place] was started on medication regimen for her presenting symptoms. She received & was discharged on; Abilify 5 mg for mood control, Duloxetine 90 mg for depression, Gabapentin 300 mg for agitation/pain management, Vistaril 25 mg prn for anxiety, Nicorette gum 2 mg for smoking cessation & Trazodone 50 mg for insomnia. She was also enrolled & participated in the group counseling sessions being offered & held on this unit. She learned coping skills. She presented other significant pre-existing health issues that requires treatment & or monitoring. She was resumed & discharged on her pertinent home medications for those health issues. She tolerated her treatment regimen without any adverse effects or reactions reported.  Kamaiyah's symptoms responded well on her treatment regimen. This is evidenced by her reports of improved mood, absence of suicidal thoughts & presentation of good affect. At her discharge interview today with her attending psychiatrist today, Yalanda reports feeling much better. She states that being on the medication has helped her a lot. She says she feels she is back to her old self. No thoughts of suicide.  No thoughts of homicide. No thoughts of violence. No access to weapons. She reports that she is in good spirits. Not feeling depressed. Reports normal energy and interest. Has been maintaining normal biological functions. She is able to think clearly. She is able to focus on task. Her thoughts are not crowded or racing. No evidence of mania. No hallucinations. She is not making any delusional statement. She is fully in touch with reality. No overwhelming anxiety. She is looking forward to spend the holiday with her family.   Amali's case was discussed at the treatment team meeting today. Nursing staff notes that she has been bright today. She has not been observed to be internally disturbed. She has not  voiced any suicidal thoughts today. Patient has been cooperative with care and has tolerated her medications well. Team members feel that patient is back to her baseline level of function. Team agreed with plan to discharge patient today to continue mental health care on an outpatient basis as noted below. She is provided with a 7 days worth, supply samples of her [facility] discharge medications. She was able to engage in safety planning including plan to [facility] or contact emergency services if she feels unable to maintain her own safety or the safety of others. Pt had no further questions, comments or concern. [facility] personal belongings in no apparent distress.   Physical Findings: AIMS: Facial and Oral Movements Muscles of Facial Expression: None, normal Lips and Perioral Area: None, normal Jaw: None, normal Tongue: None, normal,Extremity Movements Upper (arms, wrists, hands, fingers): None, normal Lower (legs, knees, ankles, toes): None, normal, Trunk Movements Neck, shoulders, hips: None, normal, Overall Severity Severity of abnormal movements (highest score from  questions above): None, normal Incapacitation due to abnormal movements: None, normal Patient's awareness of abnormal movements (rate only patient's report): No Awareness, Dental Status Current problems with teeth and/or dentures?: No Does patient usually wear dentures?: No  CIWA:  CIWA-Ar Total: 11 COWS:  COWS Total Score: 0  Musculoskeletal: Strength & Muscle Tone: within normal limits Gait & Station: normal Patient leans: N/A  Psychiatric Specialty Exam: Physical Exam  Nursing note and vitals reviewed. Constitutional: She is oriented to person, place, and time. She appears well-developed and well-nourished.  HENT:  Head: Normocephalic.  Eyes: Pupils are equal, round, and reactive to light.  Neck: Normal range of motion.  Cardiovascular: Normal rate.  Respiratory: Effort normal.  GI: Soft.   Genitourinary:  Genitourinary Comments: Deferred  Musculoskeletal: Normal range of motion.  Neurological: She is alert and oriented to person, place, and time.  Skin: Skin is warm.    Review of Systems  Constitutional: Negative.   HENT: Negative.   Eyes: Negative.   Respiratory: Negative.  Negative for cough.   Cardiovascular: Negative.  Negative for chest pain and palpitations.  Gastrointestinal: Negative.  Negative for abdominal pain, heartburn, nausea and vomiting.  Genitourinary: Negative.   Musculoskeletal: Negative.   Skin: Negative.   Neurological: Negative.  Negative for dizziness and headaches.  Endo/Heme/Allergies: Negative.   Psychiatric/Behavioral: Positive for depression (Stable) and substance abuse (Hx. Opioid, Benzodiazepine & Barbiturate use disorders  ). Negative for hallucinations, memory loss and suicidal ideas. The patient has insomnia (Stable). The patient is not nervous/anxious (Stable).     Blood pressure 106/69, pulse 71, temperature 98.8 F (37.1 C), temperature source Oral, resp. rate 20, height 5\' 2"  (1.575 m), weight 76.2 kg.Body mass index is 30.73 kg/m.  See Md's discharge SRA   Has this patient used any form of tobacco in the last 30 days? (Cigarettes, Smokeless Tobacco, Cigars, and/or Pipes): Yes, an FDA-approved tobacco cessation medication was offered at discharge.  Blood Alcohol level:  Lab Results  Component Value Date   ETH <10 02/05/2018   ETH  10/10/2006    <5        LOWEST DETECTABLE LIMIT FOR SERUM ALCOHOL IS 11 mg/dL FOR MEDICAL PURPOSES ONLY   Metabolic Disorder Labs:  Lab Results  Component Value Date   HGBA1C 6.1 (H) 12/27/2017   MPG 128.37 12/27/2017   No results found for: PROLACTIN Lab Results  Component Value Date   CHOL 210 (H) 12/27/2017   TRIG 255 (H) 12/27/2017   HDL 38 (L) 12/27/2017   CHOLHDL 5.5 12/27/2017   VLDL 51 (H) 12/27/2017   LDLCALC 121 (H) 12/27/2017   See Psychiatric Specialty Exam and Suicide  Risk Assessment completed by Attending Physician prior to discharge.  Discharge destination:  Home  Is patient on multiple antipsychotic therapies at discharge:  No   Has Patient had three or more failed trials of antipsychotic monotherapy by history:  No  Recommended Plan for Multiple Antipsychotic Therapies: NA  Allergies as of 02/13/2018      Reactions   Iohexol     Code: HIVES, Desc: dye allergy-throat closes up, SOB, chest pain, Onset Date: [id number]   Sulfa Antibiotics       Medication List    STOP taking these medications   busPIRone 10 MG tablet Commonly known as:  BUSPAR   clonazePAM 0.5 MG tablet Commonly known as:  KLONOPIN   clonazePAM 1 MG tablet Commonly known as:  KLONOPIN   guaiFENesin 100 MG/5ML liquid Commonly known as:  ROBITUSSIN   MULTIVITAMIN ADULTS 50+ Tabs     TAKE these medications     Indication  ARIPiprazole 5 MG tablet Commonly known as:  ABILIFY Take 1 tablet (5 mg total) by mouth at bedtime. For mood control/adjunct to antidepressant. What changed:  additional instructions  Indication:  Major Depressive Disorder, Mood control   atorvastatin 40 MG tablet Commonly known as:  LIPITOR Take 1 tablet (40 mg total) by mouth daily at 6 PM. For high cholesterol What changed:  additional instructions  Indication:  Inherited Heterozygous Hypercholesterolemia, High Amount of Triglycerides in the Blood   DULoxetine 30 MG capsule Commonly known as:  CYMBALTA Take 3 capsules (90 mg total) by mouth daily. For depression Start taking on:  02/14/2018 What changed:  additional instructions  Indication:  Major Depressive Disorder   fluticasone 50 MCG/ACT nasal spray Commonly known as:  FLONASE Place 1 spray into both nostrils daily. For allergies What changed:  additional instructions  Indication:  Allergic Rhinitis, Signs and Symptoms of Nose Diseases   gabapentin 300 MG capsule Commonly known as:  NEURONTIN Take 1 capsule (300 mg total) by  mouth 2 (two) times daily. For agitation What changed:  additional instructions  Indication:  Agitation   hydrOXYzine 25 MG tablet Commonly known as:  ATARAX/VISTARIL Take 1 tablet (25 mg total) by mouth 3 (three) times daily as needed for itching or anxiety.  Indication:  Feeling Anxious   nicotine 21 mg/24hr patch Commonly known as:  NICODERM CQ - dosed in mg/24 hours Place 1 patch (21 mg total) onto the skin daily. (May buy from over the counter): For smoking cessation Start taking on:  02/14/2018  Indication:  Nicotine Addiction   oxyCODONE 15 MG immediate release tablet Commonly known as:  ROXICODONE Take 1 tablet (15 mg total) by mouth 3 (three) times daily as needed (Pain). What changed:  reasons to take this  Indication:  Acute Pain, Chronic Pain   thiamine 100 MG tablet Take 1 tablet (100 mg total) by mouth daily. For thiamine replacement Start taking on:  02/14/2018  Indication:  Deficiency of Vitamin B1   traZODone 50 MG tablet Commonly known as:  DESYREL Take 1 tablet (50 mg total) by mouth at bedtime as needed for sleep. What changed:    medication strength  how much to take  Indication:  Trouble Sleeping      Follow-up Information    [facility] Follow up.   Why:  Please follow up for therapy and medication management during the walk-in hours, Monday-Friday 8:30a-12:00p and 2:00p-3:30p.  Contact information: [address] [place] [address] Phone: [phone] Fax:         Follow-up recommendations: Activity:  As tolerated Diet: As recommended by your primary care doctor. Keep all scheduled follow-up appointments as recommended.   Comments:  Patient is instructed prior to discharge to: Take all medications as prescribed by his/her mental healthcare provider. Report any adverse effects and or reactions from the medicines to his/her outpatient provider promptly. Patient has been instructed & cautioned: To not engage in alcohol  and or illegal drug use while on prescription medicines. In the event of worsening symptoms, patient is instructed to call the crisis hotline, 911 and or go to the nearest ED for appropriate evaluation and treatment of symptoms. To follow-up with his/her primary care provider for your other medical issues, concerns and or health care needs.   Signed: Armandina Stammer, NP, PMHNP, FNP-BC 02/13/2018, 12:37 PM

## 2018-02-13 NOTE — Progress Notes (Signed)
Recreation Therapy Notes  Date: 12.4.19 Time: 0930 Location: [address] Dayroom  Group Topic: Stress Management  Goal Area(s) Addresses:  Patient will verbalize importance of using healthy stress management.  Patient will identify positive emotions associated with healthy stress management.   Intervention: Stress Management  Activity :  Guided Imagery.  LRT introduced the stress management technique of guided imagery.  LRT read a script that allowed patients to envision their peaceful place.  Patients were to follow along as script was read to engage in activity.  Education:  Stress Management, Discharge Planning.   Education Outcome: Acknowledges edcuation/In group clarification offered/Needs additional education  Clinical Observations/Feedback: Pt did not attend group.     Caedyn Tassinari, LRT/CTRS         Shirlean Berman A 02/13/2018 11:27 AM 

## 2018-02-13 NOTE — Plan of Care (Signed)
Problem: Education: Goal: Knowledge of [facility] Education information/materials will improve Outcome: Completed/Met Goal: Emotional status will improve Outcome: Completed/Met Goal: Mental status will improve Outcome: Completed/Met Goal: Verbalization of understanding the information provided will improve Outcome: Completed/Met   Problem: Activity: Goal: Interest or engagement in activities will improve Outcome: Completed/Met Goal: Sleeping patterns will improve Outcome: Completed/Met   Problem: Coping: Goal: Ability to verbalize frustrations and anger appropriately will improve Outcome: Completed/Met Goal: Ability to demonstrate self-control will improve Outcome: Completed/Met   Problem: Health Behavior/Discharge Planning: Goal: Identification of resources available to assist in meeting health care needs will improve Outcome: Completed/Met Goal: Compliance with treatment plan for underlying cause of condition will improve Outcome: Completed/Met   Problem: Physical Regulation: Goal: Ability to maintain clinical measurements within normal limits will improve Outcome: Completed/Met   Problem: Safety: Goal: Periods of time without injury will increase Outcome: Completed/Met   Problem: Education: Goal: Ability to state activities that reduce stress will improve Outcome: Completed/Met   Problem: Coping: Goal: Ability to identify and develop effective coping behavior will improve Outcome: Completed/Met   Problem: Self-Concept: Goal: Ability to identify factors that promote anxiety will improve Outcome: Completed/Met Goal: Level of anxiety will decrease Outcome: Completed/Met Goal: Ability to modify response to factors that promote anxiety will improve Outcome: Completed/Met   Problem: Education: Goal: Utilization of techniques to improve thought processes will improve Outcome: Completed/Met Goal: Knowledge of the prescribed therapeutic regimen will  improve Outcome: Completed/Met   Problem: Activity: Goal: Interest or engagement in leisure activities will improve Outcome: Completed/Met Goal: Imbalance in normal sleep/wake cycle will improve Outcome: Completed/Met   Problem: Coping: Goal: Coping ability will improve Outcome: Completed/Met Goal: Will verbalize feelings Outcome: Completed/Met   Problem: Health Behavior/Discharge Planning: Goal: Ability to make decisions will improve Outcome: Completed/Met Goal: Compliance with therapeutic regimen will improve Outcome: Completed/Met   Problem: Role Relationship: Goal: Will demonstrate positive changes in social behaviors and relationships Outcome: Completed/Met   Problem: Safety: Goal: Ability to disclose and discuss suicidal ideas will improve Outcome: Completed/Met Goal: Ability to identify and utilize support systems that promote safety will improve Outcome: Completed/Met   Problem: Self-Concept: Goal: Will verbalize positive feelings about self Outcome: Completed/Met Goal: Level of anxiety will decrease Outcome: Completed/Met   Problem: Activity: Goal: Will identify at least one activity in which they can participate Outcome: Completed/Met   Problem: Coping: Goal: Ability to identify and develop effective coping behavior will improve Outcome: Completed/Met Goal: Ability to interact with others will improve Outcome: Completed/Met Goal: Demonstration of participation in decision-making regarding own care will improve Outcome: Completed/Met Goal: Ability to use eye contact when communicating with others will improve Outcome: Completed/Met   Problem: Health Behavior/Discharge Planning: Goal: Identification of resources available to assist in meeting health care needs will improve Outcome: Completed/Met   Problem: Self-Concept: Goal: Will verbalize positive feelings about self Outcome: Completed/Met   Problem: Education: Goal: Knowledge of disease or  condition will improve Outcome: Completed/Met Goal: Understanding of discharge needs will improve Outcome: Completed/Met   Problem: Health Behavior/Discharge Planning: Goal: Ability to identify changes in lifestyle to reduce recurrence of condition will improve Outcome: Completed/Met Goal: Identification of resources available to assist in meeting health care needs will improve Outcome: Completed/Met   Problem: Physical Regulation: Goal: Complications related to the disease process, condition or treatment will be avoided or minimized Outcome: Completed/Met   Problem: Safety: Goal: Ability to remain free from injury will improve Outcome: Completed/Met   Problem: Education: Goal: Ability to  make informed decisions regarding treatment will improve Outcome: Completed/Met   Problem: Coping: Goal: Coping ability will improve Outcome: Completed/Met   Problem: Health Behavior/Discharge Planning: Goal: Identification of resources available to assist in meeting health care needs will improve Outcome: Completed/Met   Problem: Medication: Goal: Compliance with prescribed medication regimen will improve Outcome: Completed/Met   Problem: Self-Concept: Goal: Ability to disclose and discuss suicidal ideas will improve Outcome: Completed/Met Goal: Will verbalize positive feelings about self Outcome: Completed/Met

## 2018-02-13 NOTE — Progress Notes (Signed)
Nursing discharge note: Patient discharged home per MD order.  Patient received all personal belongings from unit and locker.  Reviewed AVS/transition record with patient and she indicates understanding.  Patient will follow up with her outpatient provider.  Patient denies any thoughts of self harm. She left ambulatory with a friend.

## 2018-02-13 NOTE — Progress Notes (Signed)
Adult Psychoeducational Group Note  Date:  02/13/2018 Time:  9:09 AM  Group Topic/Focus:  Goals Group:   The focus of this group is to help patients establish daily goals to achieve during treatment and discuss how the patient can incorporate goal setting into their daily lives to aide in recovery.  Participation Level:  Active  Participation Quality:  Appropriate  Affect:  Appropriate  Cognitive:  Appropriate  Insight: Appropriate  Engagement in Group:  Engaged  Modes of Intervention:  Discussion  Additional Comments:  Pt attended and participated in morning group.   Deforest Hoyleslexandre J Idris Edmundson 02/13/2018, 9:09 AM

## 2018-10-24 ENCOUNTER — Emergency Department (HOSPITAL_BASED_OUTPATIENT_CLINIC_OR_DEPARTMENT_OTHER): Payer: Medicaid Other

## 2018-10-24 ENCOUNTER — Encounter (HOSPITAL_BASED_OUTPATIENT_CLINIC_OR_DEPARTMENT_OTHER): Payer: Self-pay

## 2018-10-24 ENCOUNTER — Other Ambulatory Visit: Payer: Self-pay

## 2018-10-24 ENCOUNTER — Emergency Department (HOSPITAL_BASED_OUTPATIENT_CLINIC_OR_DEPARTMENT_OTHER)
Admission: EM | Admit: 2018-10-24 | Discharge: 2018-10-24 | Disposition: A | Discharge status: Home / Self Care | Payer: Medicaid Other | Attending: Emergency Medicine | Admitting: Emergency Medicine

## 2018-10-24 DIAGNOSIS — Z96652 Presence of left artificial knee joint: Secondary | ICD-10-CM | POA: Diagnosis not present

## 2018-10-24 DIAGNOSIS — W230XXA Caught, crushed, jammed, or pinched between moving objects, initial encounter: Secondary | ICD-10-CM | POA: Insufficient documentation

## 2018-10-24 DIAGNOSIS — F17210 Nicotine dependence, cigarettes, uncomplicated: Secondary | ICD-10-CM | POA: Insufficient documentation

## 2018-10-24 DIAGNOSIS — Y939 Activity, unspecified: Secondary | ICD-10-CM | POA: Insufficient documentation

## 2018-10-24 DIAGNOSIS — S62635A Displaced fracture of distal phalanx of left ring finger, initial encounter for closed fracture: Secondary | ICD-10-CM | POA: Diagnosis not present

## 2018-10-24 DIAGNOSIS — I509 Heart failure, unspecified: Secondary | ICD-10-CM | POA: Diagnosis not present

## 2018-10-24 DIAGNOSIS — Y999 Unspecified external cause status: Secondary | ICD-10-CM | POA: Diagnosis not present

## 2018-10-24 DIAGNOSIS — Z79899 Other long term (current) drug therapy: Secondary | ICD-10-CM | POA: Diagnosis not present

## 2018-10-24 DIAGNOSIS — Y92810 Car as the place of occurrence of the external cause: Secondary | ICD-10-CM | POA: Insufficient documentation

## 2018-10-24 DIAGNOSIS — S6992XA Unspecified injury of left wrist, hand and finger(s), initial encounter: Secondary | ICD-10-CM | POA: Diagnosis present

## 2018-10-24 MED ORDER — HYDROCODONE-ACETAMINOPHEN 5-325 MG PO TABS: 1.0000 | ORAL_TABLET | Freq: Four times a day (QID) | ORAL | 0 refills | Status: DC | PRN

## 2018-10-24 MED ORDER — HYDROCODONE-ACETAMINOPHEN 5-325 MG PO TABS: 1.0000 | ORAL_TABLET | Freq: Four times a day (QID) | ORAL | 0 refills | Status: AC | PRN

## 2018-10-24 MED ORDER — IBUPROFEN 800 MG PO TABS: 800.0000 mg | ORAL_TABLET | Freq: Three times a day (TID) | ORAL | 0 refills | Status: AC | PRN

## 2018-10-24 MED ORDER — HYDROCODONE-ACETAMINOPHEN 5-325 MG PO TABS
1.0000 | ORAL_TABLET | Freq: Once | ORAL | Status: AC
  Administered 2018-10-24: 1 via ORAL
  Filled 2018-10-24: qty 1

## 2018-10-24 MED ORDER — IBUPROFEN 800 MG PO TABS: 800.0000 mg | ORAL_TABLET | Freq: Three times a day (TID) | ORAL | 0 refills | Status: DC | PRN

## 2018-10-24 NOTE — ED Notes (Signed)
Pt returned from XR at this time. 

## 2018-10-24 NOTE — Discharge Instructions (Addendum)
It was my pleasure taking care of you today!   The patient as needed for mild to moderate pain.  Use narcotic pain medication only as needed for severe pain.  Call the hand surgeon listed or your orthopedic clinic first thing tomorrow morning to schedule a follow-up appointment.  Keep splint on at all times.  Return to ER for new or worsening symptoms, any additional concerns.

## 2018-10-24 NOTE — ED Triage Notes (Signed)
Pt states she closed left ring finger in door ~530pm-no break in skin noted-NAD-steady gait

## 2018-10-24 NOTE — ED Notes (Signed)
Patient transported to X-ray 

## 2018-10-24 NOTE — ED Provider Notes (Signed)
[facility] EMERGENCY DEPARTMENT Provider Note   CSN: [id number] Arrival date & time: 10/24/18  1924     History   Chief Complaint Chief Complaint  Patient presents with  . Finger Injury    HPI Brooke [name] is a 53 y.o. female.     The history is provided by the patient and medical records. No language interpreter was used.   Brooke [name] is a 53 y.o. female  with a PMH as listed below who presents to the Emergency Department complaining of left ring finger pain after slamming her finger in a car door just prior to arrival.  Patient has pain with any type of movement of the fourth digit.  No open wounds.  No numbness.  No medications taken prior to arrival for symptoms.  Past Medical History:  Diagnosis Date  . CHF (congestive heart failure) ([place])   . DDD (degenerative disc disease), lumbar   . DDD (degenerative disc disease), lumbar   . Osteoarthritis     Patient Active Problem List   Diagnosis Date Noted  . MDD (major depressive disorder), severe ([place]) 02/06/2018  . Tobacco abuse 12/27/2017  . Leukocytosis 12/27/2017  . Chest pain 12/26/2017  . Dyspnea on exertion 12/18/2017  . Atypical chest pain 12/18/2017  . Generalized anxiety disorder   . Chronic pain syndrome   . MDD (major depressive disorder) 06/04/2017  . Suicidal ideation 06/02/2017  . MDD (major depressive disorder), recurrent severe, without psychosis ([place])   . Dehydration 05/30/2017    Past Surgical History:  Procedure Laterality Date  . BACK SURGERY    . knee replacement Left   . TOTAL KNEE ARTHROPLASTY       OB History   No obstetric history on file.      Home Medications    Prior to Admission medications   Medication Sig Start Date End Date Taking? Authorizing Provider  ARIPiprazole (ABILIFY) 5 MG tablet Take 1 tablet (5 mg total) by mouth at bedtime. For mood control/adjunct to antidepressant. 02/13/18   Lindell Spar I, NP  atorvastatin (LIPITOR) 40 MG tablet Take 1  tablet (40 mg total) by mouth daily at 6 PM. For high cholesterol 02/13/18   Nwoko, Herbert Pun I, NP  DULoxetine (CYMBALTA) 30 MG capsule Take 3 capsules (90 mg total) by mouth daily. For depression 02/14/18   Lindell Spar I, NP  fluticasone (FLONASE) 50 MCG/ACT nasal spray Place 1 spray into both nostrils daily. For allergies 02/13/18   Lindell Spar I, NP  gabapentin (NEURONTIN) 300 MG capsule Take 1 capsule (300 mg total) by mouth 2 (two) times daily. For agitation 02/13/18   Lindell Spar I, NP  HYDROcodone-acetaminophen (NORCO/VICODIN) 5-325 MG tablet Take 1 tablet by mouth every 6 (six) hours as needed for severe pain. 10/24/18   Ward, Ozella Almond, PA-C  hydrOXYzine (ATARAX/VISTARIL) 25 MG tablet Take 1 tablet (25 mg total) by mouth 3 (three) times daily as needed for itching or anxiety. 02/13/18   Lindell Spar I, NP  ibuprofen (ADVIL) 800 MG tablet Take 1 tablet (800 mg total) by mouth every 8 (eight) hours as needed. 10/24/18   Ward, Ozella Almond, PA-C  nicotine (NICODERM CQ - DOSED IN MG/24 HOURS) 21 mg/24hr patch Place 1 patch (21 mg total) onto the skin daily. (May buy from over the counter): For smoking cessation 02/14/18   Lindell Spar I, NP  oxyCODONE (ROXICODONE) 15 MG immediate release tablet Take 1 tablet (15 mg total) by mouth 3 (three) times  daily as needed (Pain). 02/13/18   Armandina StammerNwoko, Agnes I, NP  thiamine 100 MG tablet Take 1 tablet (100 mg total) by mouth daily. For thiamine replacement 02/14/18   Armandina StammerNwoko, Agnes I, NP  traZODone (DESYREL) 50 MG tablet Take 1 tablet (50 mg total) by mouth at bedtime as needed for sleep. 02/13/18   Sanjuana KavaNwoko, Agnes I, NP    Family History Family History  Problem Relation Age of Onset  . Heart attack Mother   . Breast cancer Mother     Social History Social History   Tobacco Use  . Smoking status: Current Every Day Smoker    Packs/day: 2.00    Years: 29.00    Pack years: 58.00    Types: Cigarettes  . Smokeless tobacco: Never Used  Substance Use Topics  .  Alcohol use: Not Currently  . Drug use: Never     Allergies   Iohexol and Sulfa antibiotics   Review of Systems Review of Systems  Musculoskeletal: Positive for arthralgias and myalgias.  Skin: Negative for color change and wound.  Neurological: Negative for weakness and numbness.     Physical Exam Updated Vital Signs BP (!) 142/88 (BP Location: Right Arm)   Pulse 83   Temp 98.5 F (36.9 C) (Oral)   Resp 20   Ht 5\' 3"  (1.6 m)   Wt 65.8 kg   SpO2 100%   BMI 25.69 kg/m   Physical Exam Vitals signs and nursing note reviewed.  Constitutional:      General: She is not in acute distress.    Appearance: She is well-developed.  HENT:     Head: Normocephalic and atraumatic.  Neck:     Musculoskeletal: Neck supple.  Cardiovascular:     Rate and Rhythm: Normal rate and regular rhythm.     Heart sounds: Normal heart sounds. No murmur.  Pulmonary:     Effort: Pulmonary effort is normal. No respiratory distress.     Breath sounds: Normal breath sounds. No wheezing or rales.  Musculoskeletal:     Comments: Left ring finger being held in passive flexion.  Significant pain with movement of the MCP or PIP joint and tender as well.  No open wounds.  Good cap refill.  Sensation intact.  2+ radial pulse.  Skin:    General: Skin is warm and dry.  Neurological:     Mental Status: She is alert.      ED Treatments / Results  Labs (all labs ordered are listed, but only abnormal results are displayed) Labs Reviewed - No data to display  EKG None  Radiology Dg Finger Ring Left  Result Date: 10/24/2018 CLINICAL DATA:  Left ring finger pain after injury, shut finger in door prior [place]. EXAM: LEFT RING FINGER 2+V COMPARISON:  Hand radiograph 01/24/2010 FINDINGS: Oblique minimally displaced fracture of the fourth digit distal phalanx without intra-articular extension. No other acute fracture. Remote proximal phalanx fracture. The digit is held in flexion at the proximal  interphalangeal joint on all views, unchanged from prior exam. Mild soft tissue edema. IMPRESSION: Oblique minimally displaced fourth digit distal phalanx fracture. Electronically Signed   By: Narda RutherfordMelanie  Sanford M.D.   On: 10/24/2018 20:16    Procedures Procedures (including critical care time)  Medications Ordered in ED Medications  HYDROcodone-acetaminophen (NORCO/VICODIN) 5-325 MG per tablet 1 tablet (1 tablet Oral Given 10/24/18 2018)     Initial Impression / Assessment and Plan / ED Course  I have reviewed the triage vital signs and the  nursing notes.  Pertinent labs & imaging results that were available during my care of the patient were reviewed by me and considered in my medical decision making (see chart for details).       Unknown Jimmyee M Sferrazza is a 53 y.o. female who presents to ED for evaluation after shutting her left ring finger in the car door just prior to arrival.  Neurovascularly intact.  No open wounds.  X-ray shows oblique minimally displaced fracture of the fourth distal phalanx without intra-articular involvement.  Imaging reviewed independently by myself and attending, Dr. Donnald GarrePfeiffer, recommending long finger splint and follow-up with hand surgery.  Discussed home care instructions, follow-up plan and return precautions.  All questions were answered.   Final Clinical Impressions(s) / ED Diagnoses   Final diagnoses:  Closed displaced fracture of distal phalanx of left ring finger, initial encounter    ED Discharge Orders         Ordered    HYDROcodone-acetaminophen (NORCO/VICODIN) 5-325 MG tablet  Every 6 hours PRN,   Status:  Discontinued     10/24/18 2041    ibuprofen (ADVIL) 800 MG tablet  Every 8 hours PRN,   Status:  Discontinued     10/24/18 2041    HYDROcodone-acetaminophen (NORCO/VICODIN) 5-325 MG tablet  Every 6 hours PRN     10/24/18 2042    ibuprofen (ADVIL) 800 MG tablet  Every 8 hours PRN     10/24/18 2042           Ward, Chase PicketJaime Pilcher, PA-C 10/24/18  2046    Arby BarrettePfeiffer, Marcy, MD 10/28/18 1210

## 2019-01-16 ENCOUNTER — Ambulatory Visit: Payer: Medicaid Other | Admitting: Cardiology

## 2019-04-30 ENCOUNTER — Other Ambulatory Visit: Payer: Self-pay

## 2019-04-30 ENCOUNTER — Emergency Department (HOSPITAL_BASED_OUTPATIENT_CLINIC_OR_DEPARTMENT_OTHER)
Admission: EM | Admit: 2019-04-30 | Discharge: 2019-04-30 | Disposition: A | Discharge status: Home / Self Care | Payer: Medicaid Other | Attending: Emergency Medicine | Admitting: Emergency Medicine

## 2019-04-30 ENCOUNTER — Emergency Department (HOSPITAL_BASED_OUTPATIENT_CLINIC_OR_DEPARTMENT_OTHER): Payer: Medicaid Other

## 2019-04-30 ENCOUNTER — Encounter (HOSPITAL_BASED_OUTPATIENT_CLINIC_OR_DEPARTMENT_OTHER): Payer: Self-pay | Admitting: *Deleted

## 2019-04-30 DIAGNOSIS — I509 Heart failure, unspecified: Secondary | ICD-10-CM | POA: Insufficient documentation

## 2019-04-30 DIAGNOSIS — R0789 Other chest pain: Secondary | ICD-10-CM | POA: Diagnosis present

## 2019-04-30 DIAGNOSIS — Z91041 Radiographic dye allergy status: Secondary | ICD-10-CM | POA: Diagnosis not present

## 2019-04-30 DIAGNOSIS — Z882 Allergy status to sulfonamides status: Secondary | ICD-10-CM | POA: Diagnosis not present

## 2019-04-30 DIAGNOSIS — Z79899 Other long term (current) drug therapy: Secondary | ICD-10-CM | POA: Insufficient documentation

## 2019-04-30 DIAGNOSIS — F17210 Nicotine dependence, cigarettes, uncomplicated: Secondary | ICD-10-CM | POA: Insufficient documentation

## 2019-04-30 DIAGNOSIS — R0781 Pleurodynia: Secondary | ICD-10-CM

## 2019-04-30 MED ORDER — OXYCODONE-ACETAMINOPHEN 5-325 MG PO TABS
1.0000 | ORAL_TABLET | Freq: Once | ORAL | Status: AC
  Administered 2019-04-30: 21:00:00 1 via ORAL
  Filled 2019-04-30: qty 1

## 2019-04-30 MED ORDER — OXYCODONE-ACETAMINOPHEN 5-325 MG PO TABS: 1.0000 | ORAL_TABLET | Freq: Four times a day (QID) | ORAL | 0 refills | Status: AC | PRN

## 2019-04-30 NOTE — ED Notes (Signed)
No report received by [facility] , no vitals reported and no signature
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
## 2019-04-30 NOTE — Discharge Instructions (Addendum)
You very likely have fractured rib on your right side.  We did not see this on her x-ray, but as I explained to you, it is still possible you have a rib fracture.  This will likely heal in the next 4 to 6 weeks.  As we discussed, you will need to take slow deep breaths 3-4 times a day, concentrating on slow inhalation, in order to keep your lungs healthy.  You can take motrin/advil 600 mg every 6 hours as needed for pain.  You can take percocet for severe pain, every 6 hours.
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
## 2019-07-27 IMAGING — DX DG CHEST 2V
2 series · 2 of 2 positions shown · non-contrast
Comparison: 12/16/2017

CLINICAL DATA: Chest pain

EXAM:
CHEST - 2 VIEW

[chest pa]
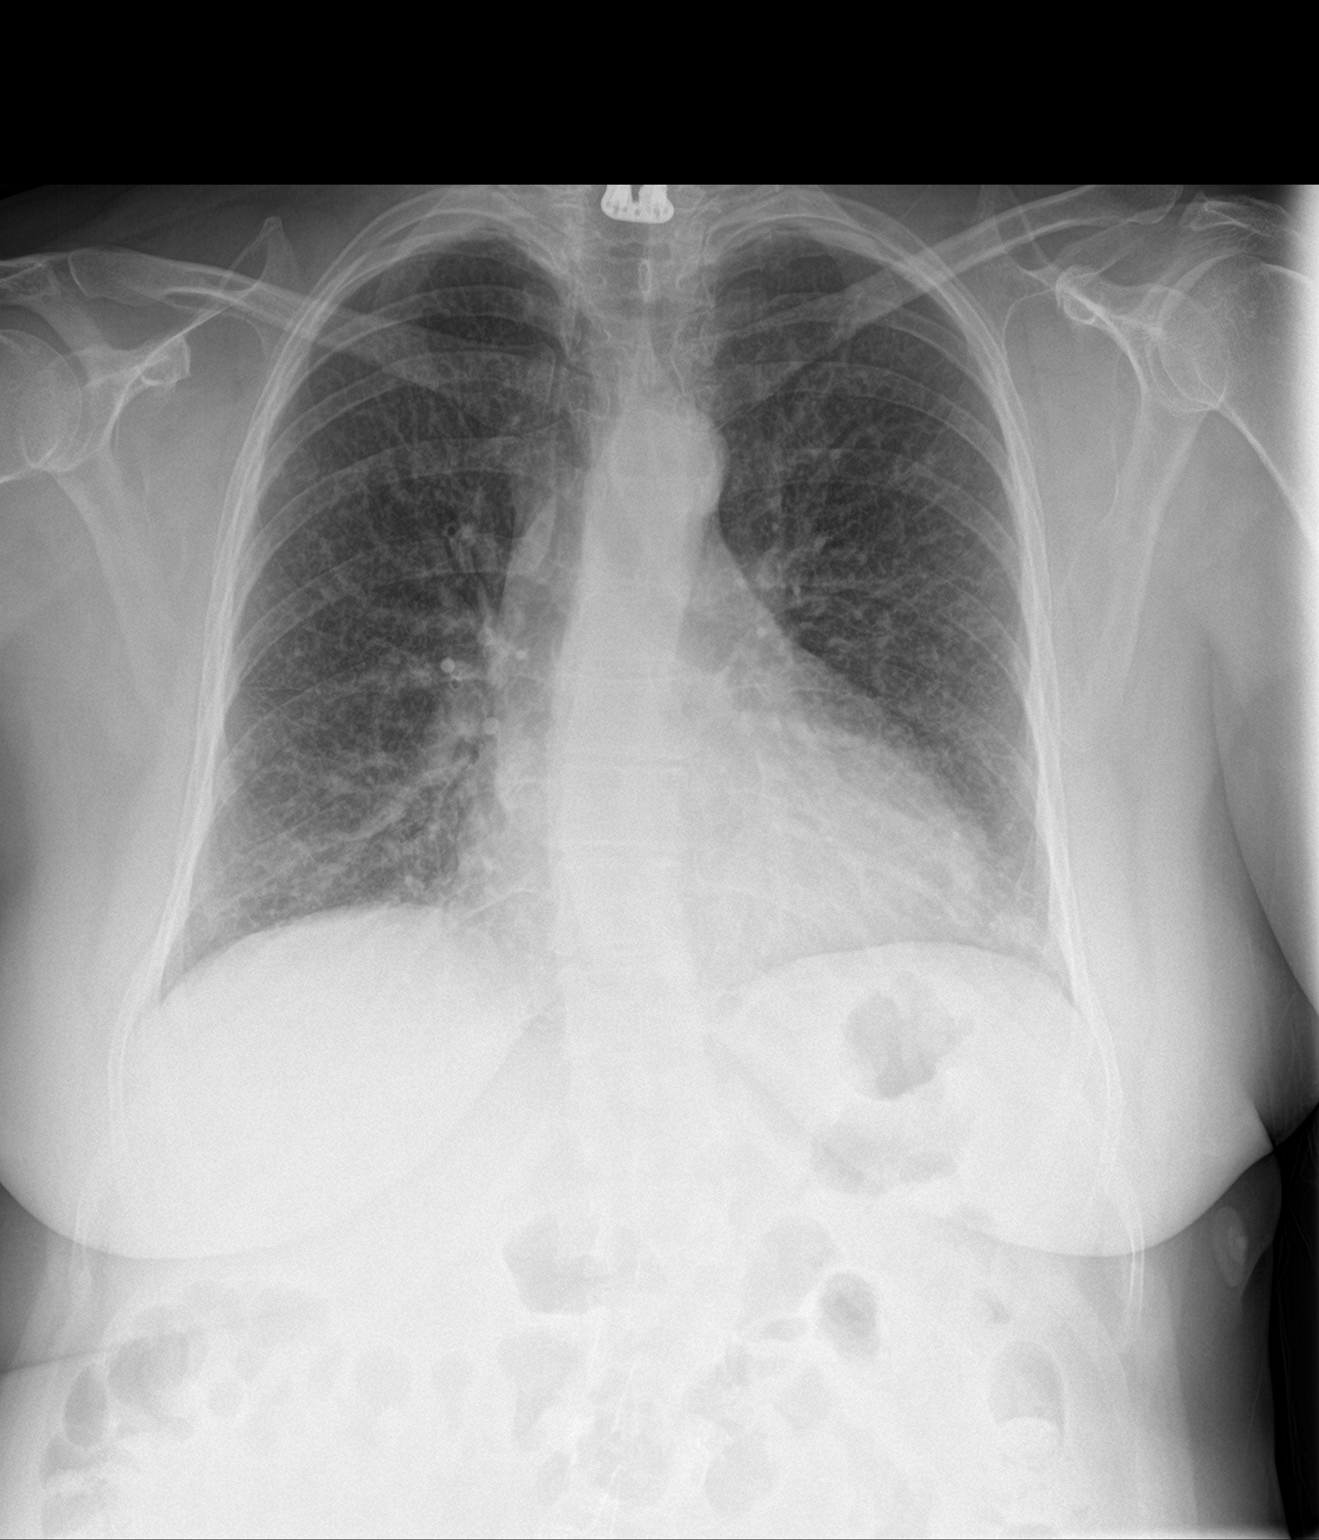

[chest lat]
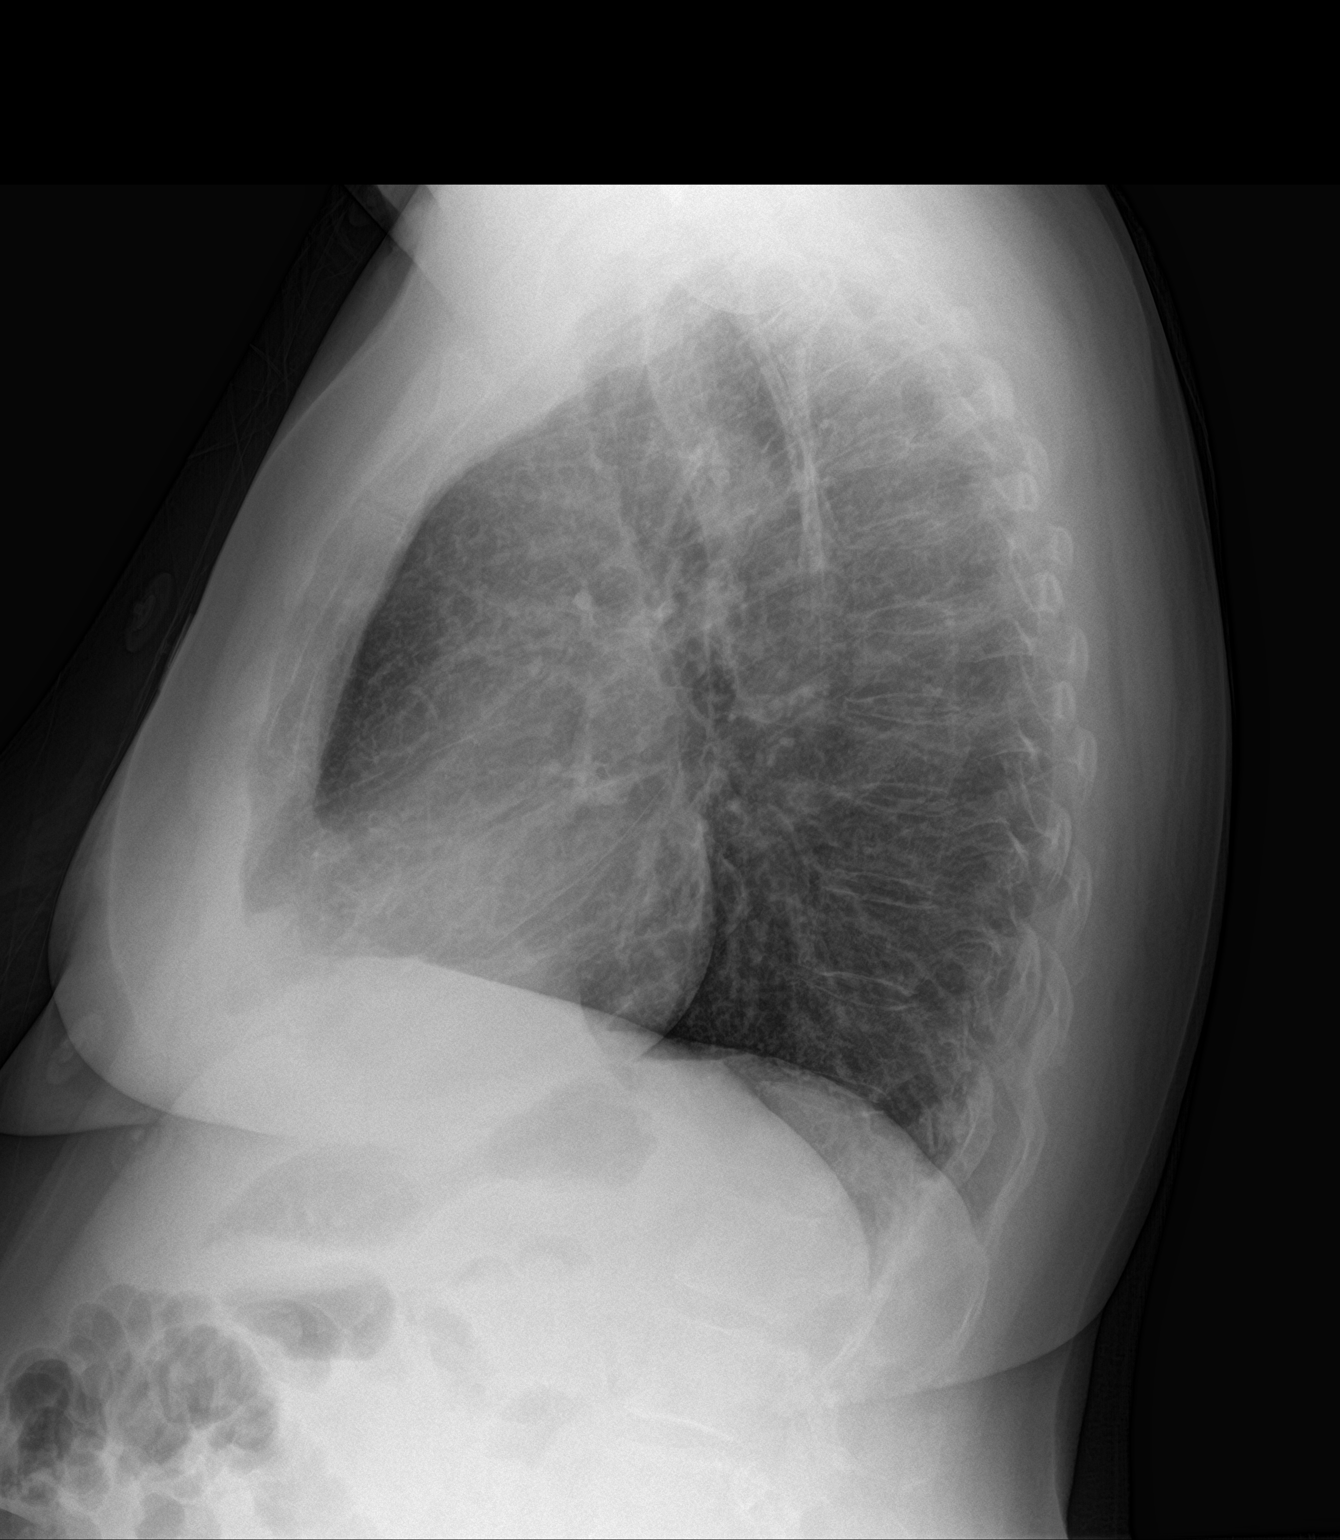

[2 of 2 positions shown; findings below may reference images not displayed]

FINDINGS: Postsurgical changes at the cervicothoracic junction. Mild
cardiomegaly with vascular congestion. Mild diffuse chronic
interstitial opacity. No focal airspace disease or pneumothorax.
IMPRESSION: Cardiomegaly with mild vascular congestion.

## 2021-07-11 DEATH — deceased
